# Patient Record
Sex: Male | Born: 1945 | Race: Black or African American | Hispanic: No | Marital: Single | Smoking: Current every day smoker
Health system: Southern US, Community
[De-identification: ages and names within clinical notes are randomized; demographics above are authoritative.]

## PROBLEM LIST (undated history)

## (undated) ENCOUNTER — Emergency Department: Admission: EM | Payer: Medicare HMO | Source: Home / Self Care

## (undated) ENCOUNTER — Emergency Department: Admission: EM | Discharge status: Absent without leave | Payer: Medicare PPO

## (undated) DIAGNOSIS — I739 Peripheral vascular disease, unspecified: Secondary | ICD-10-CM

## (undated) DIAGNOSIS — H409 Unspecified glaucoma: Secondary | ICD-10-CM

## (undated) DIAGNOSIS — I639 Cerebral infarction, unspecified: Secondary | ICD-10-CM

## (undated) DIAGNOSIS — E785 Hyperlipidemia, unspecified: Secondary | ICD-10-CM

## (undated) DIAGNOSIS — E119 Type 2 diabetes mellitus without complications: Secondary | ICD-10-CM

## (undated) DIAGNOSIS — N400 Enlarged prostate without lower urinary tract symptoms: Secondary | ICD-10-CM

## (undated) DIAGNOSIS — D126 Benign neoplasm of colon, unspecified: Secondary | ICD-10-CM

## (undated) DIAGNOSIS — I10 Essential (primary) hypertension: Secondary | ICD-10-CM

## (undated) DIAGNOSIS — G459 Transient cerebral ischemic attack, unspecified: Secondary | ICD-10-CM

## (undated) DIAGNOSIS — R569 Unspecified convulsions: Secondary | ICD-10-CM

## (undated) HISTORY — PX: EYE SURGERY: SHX253

## (undated) HISTORY — PX: COLONOSCOPY: SHX174

## (undated) HISTORY — PX: HERNIA REPAIR: SHX51

## (undated) HISTORY — PX: FOOT SURGERY: SHX648

## (undated) HISTORY — PX: ROTATOR CUFF REPAIR: SHX139

---

## 2004-04-22 ENCOUNTER — Emergency Department: Payer: Self-pay | Admitting: Emergency Medicine

## 2004-05-15 ENCOUNTER — Ambulatory Visit: Payer: Self-pay | Admitting: Podiatry

## 2004-11-13 ENCOUNTER — Ambulatory Visit: Payer: Self-pay | Admitting: Endocrinology

## 2004-11-19 ENCOUNTER — Ambulatory Visit: Payer: Self-pay | Admitting: Endocrinology

## 2005-01-18 ENCOUNTER — Other Ambulatory Visit: Payer: Self-pay

## 2005-01-18 ENCOUNTER — Emergency Department: Payer: Self-pay | Admitting: Emergency Medicine

## 2005-01-28 ENCOUNTER — Ambulatory Visit: Payer: Self-pay

## 2005-04-16 ENCOUNTER — Ambulatory Visit (HOSPITAL_COMMUNITY): Admission: RE | Admit: 2005-04-16 | Discharge: 2005-04-16 | Payer: Self-pay | Admitting: *Deleted

## 2005-06-15 ENCOUNTER — Ambulatory Visit: Payer: Self-pay | Admitting: Internal Medicine

## 2005-06-21 ENCOUNTER — Ambulatory Visit: Payer: Self-pay | Admitting: Psychiatry

## 2005-06-29 ENCOUNTER — Ambulatory Visit: Payer: Self-pay | Admitting: Psychiatry

## 2006-05-23 ENCOUNTER — Ambulatory Visit: Payer: Self-pay | Admitting: Internal Medicine

## 2006-07-03 ENCOUNTER — Other Ambulatory Visit: Payer: Self-pay

## 2006-07-04 ENCOUNTER — Inpatient Hospital Stay: Payer: Self-pay | Admitting: Internal Medicine

## 2007-04-04 ENCOUNTER — Ambulatory Visit: Payer: Self-pay

## 2007-08-18 ENCOUNTER — Ambulatory Visit: Payer: Self-pay | Admitting: Unknown Physician Specialty

## 2007-09-15 ENCOUNTER — Ambulatory Visit: Payer: Self-pay | Admitting: Unknown Physician Specialty

## 2007-09-15 ENCOUNTER — Other Ambulatory Visit: Payer: Self-pay

## 2007-10-04 ENCOUNTER — Ambulatory Visit: Payer: Self-pay | Admitting: Unknown Physician Specialty

## 2007-12-01 ENCOUNTER — Emergency Department: Payer: Self-pay | Admitting: Internal Medicine

## 2007-12-01 ENCOUNTER — Other Ambulatory Visit: Payer: Self-pay

## 2009-01-01 ENCOUNTER — Ambulatory Visit: Payer: Self-pay | Admitting: Internal Medicine

## 2009-06-28 ENCOUNTER — Emergency Department: Payer: Self-pay | Admitting: Internal Medicine

## 2009-07-04 ENCOUNTER — Encounter: Payer: Self-pay | Admitting: Internal Medicine

## 2009-07-30 ENCOUNTER — Encounter: Payer: Self-pay | Admitting: Internal Medicine

## 2009-08-29 ENCOUNTER — Encounter: Payer: Self-pay | Admitting: Internal Medicine

## 2009-11-04 ENCOUNTER — Ambulatory Visit: Payer: Self-pay | Admitting: Gastroenterology

## 2010-01-19 ENCOUNTER — Ambulatory Visit: Payer: Self-pay | Admitting: Internal Medicine

## 2010-02-05 ENCOUNTER — Ambulatory Visit: Payer: Self-pay | Admitting: Neurology

## 2010-09-08 ENCOUNTER — Observation Stay: Payer: Self-pay | Admitting: Internal Medicine

## 2010-09-20 ENCOUNTER — Emergency Department: Payer: Self-pay | Admitting: Emergency Medicine

## 2011-01-08 ENCOUNTER — Emergency Department: Payer: Self-pay | Admitting: *Deleted

## 2011-07-28 ENCOUNTER — Ambulatory Visit: Payer: Self-pay | Admitting: Internal Medicine

## 2011-11-03 ENCOUNTER — Emergency Department: Payer: Self-pay | Admitting: Emergency Medicine

## 2012-10-20 ENCOUNTER — Inpatient Hospital Stay: Payer: Self-pay | Admitting: Internal Medicine

## 2012-10-20 LAB — COMPREHENSIVE METABOLIC PANEL
BUN: 13 mg/dL (ref 7–18)
Bilirubin,Total: 0.3 mg/dL (ref 0.2–1.0)
Calcium, Total: 8.7 mg/dL (ref 8.5–10.1)
Chloride: 105 mmol/L (ref 98–107)
Co2: 27 mmol/L (ref 21–32)
Creatinine: 1.03 mg/dL (ref 0.60–1.30)
EGFR (African American): >60
Osmolality: 277 (ref 275–301)
Potassium: 3.6 mmol/L (ref 3.5–5.1)
SGPT (ALT): 17 U/L (ref 12–78)
Sodium: 137 mmol/L (ref 136–145)
Total Protein: 7.2 g/dL (ref 6.4–8.2)

## 2012-10-20 LAB — URINALYSIS, COMPLETE
Bacteria: NONE SEEN
Bilirubin,UR: NEGATIVE
Blood: NEGATIVE
Leukocyte Esterase: NEGATIVE
Nitrite: NEGATIVE
Ph: 5 (ref 4.5–8.0)
RBC,UR: <1 /HPF (ref 0–5)
WBC UR: 1 /HPF (ref 0–5)

## 2012-10-20 LAB — CBC
MCH: 31.5 pg (ref 26.0–34.0)
RBC: 4.22 10*6/uL — ABNORMAL LOW (ref 4.40–5.90)

## 2012-10-21 LAB — LIPID PANEL
Cholesterol: 173 mg/dL (ref 0–200)
Ldl Cholesterol, Calc: 97 mg/dL (ref 0–100)
Triglycerides: 145 mg/dL (ref 0–200)
VLDL Cholesterol, Calc: 29 mg/dL (ref 5–40)

## 2012-10-21 LAB — HEMOGLOBIN A1C: Hemoglobin A1C: 7 % — ABNORMAL HIGH (ref 4.2–6.3)

## 2014-06-21 NOTE — Discharge Summary (Signed)
PATIENT NAME:  Peter Howard, Peter Howard MR#:  [id number] DATE OF BIRTH:  [birth year]  DATE OF ADMISSION:  10/20/2012 DATE OF DISCHARGE:  10/22/2012  DISCHARGE DIAGNOSES: 1.  Transient ischemic attack.  2.  Atherosclerotic cardiovascular disease.  3.  Seizure disorder.  4.  Diabetes mellitus, noninsulin-requiring.  5.  Benign prostatic hypertrophy.  6.  Depression.  7.  Osteoarthritis.   DISCHARGE MEDICATIONS: Aspirin 81 mg daily, Flomax 0.4 mg daily, oxybutynin 5 mg daily, bisoprolol 5 mg daily, citalopram 20 mg daily, glipizide 5 mg daily, Keppra 750 mg b.i.d., meclizine 25 mg t.i.d. p.r.n. itching, Percocet 5/325, 1 t.i.d. p.r.n. pain, simvastatin 20 mg at bedtime.   REASON FOR ADMISSION: This 69 year old male presented with dysarthria and dysphagia. Please see H and P for history of present illness, past medical history, and physical exam.   HOSPITAL COURSE: The patient was admitted. Enzymes were negative. Brain CT unremarkable. Brain MRI showed old disease, but nothing acute. Carotid Doppler and echocardiogram were unremarkable. The patient was placed on aspirin and Lovenox. His symptoms improved back to baseline. In light of his symptoms, the presumptive diagnosis is a transient ischemic attack. He had not been on any anticoagulation, and aspirin was added at  81 mg daily. He is already on lipid therapy. He will be getting physical therapy for his chronic leg pain and ataxia.   Overall prognosis is guarded.    ____________________________ Rusty Aus, MD mfm:dm D: 10/22/2012 08:10:15 ET T: 10/22/2012 14:47:22 ET JOB#: [id number]  cc: Rusty Aus, MD, <Dictator> Hiliary Osorto Roselee Culver MD ELECTRONICALLY SIGNED 10/24/2012 7:55

## 2014-06-21 NOTE — H&P (Signed)
PATIENT NAME:  Peter Howard, Peter Howard MR#:  [id number] DATE OF BIRTH:  09-Jun-1945  PRIMARY CARE PHYSICIAN: Emily Filbert, MD, of [facility].   CHIEF COMPLAINT: Dysarthria, drooling from the mouth.   HISTORY OF PRESENT ILLNESS: A 69 year old African American male patient with a history of hypertension, diabetes, a prior CVA with residual left-sided weakness, who presents to the Emergency Room complaining of 3 days of dysarthria and drooling from the mouth. This was noticed by the wife. The patient mentioned that he feels like he has some new-onset dysphagia where he has to clear his throat multiple times just with saliva. He has trouble with food. He does have residual right-sided weakness, which has not changed. Decreased sensations on the right have not changed.   No recent change in medications. No fall, syncope, chest pain, shortness of breath.   PAST MEDICAL HISTORY:  1.  CVA x 3.  2.  Seizures.  3.  Diabetes mellitus, type 2.  4.  BPH.  5.  Depression.  6.  Glaucoma.  7.  Hypertension.  8.  CAD, status post stent.  9.  Hernia repair.  10.  Left foot surgery.   ALLERGIES: PENICILLIN.   SOCIAL HISTORY: The patient smokes on and off, is presently trying some nicotine patches. No alcohol. No illicit drugs. He is married, lives with his wife. Ambulates with a cane.   FAMILY HISTORY: The patient is unaware.  REVIEW OF SYSTEMS:  CONSTITUTIONAL: No fever, fatigue, weakness, weight loss, weight gain.  EYES: No blurry vision, pain, redness. Has glaucoma.  ENT: No tinnitus, ear pain, hearing loss.  RESPIRATORY: No cough, wheezing, hemoptysis.  CARDIOVASCULAR: No chest pain, orthopnea, edema.  GASTROINTESTINAL: No nausea, vomiting, diarrhea, abdominal pain.  GENITOURINARY: No dysuria, hematuria or frequency. Has BPH.  ENDOCRINE: No polyuria, nocturia or thyroid problems.  HEMATOLOGIC, LYMPHATIC: No anemia, easy bruising, bleeding.  INTEGUMENTARY: No acne, rash, lesions.  MUSCULOSKELETAL: Has some  arthritis. No back pain.  NEUROLOGIC: Has residual right-sided weakness and numbness from prior stroke. Has new dysarthria and dysphagia.  PSYCHIATRIC: No anxiety or depression.    HOME MEDICATIONS:  Include:  1.  Flomax 0.4 mg oral daily.  2.  Oxybutynin 5 mg daily.  3.  Bystolic 5 mg daily.  4.  Citalopram 20 mg oral daily.   6.  Glipizide 5 mg oral daily.  7.  Hydroxyzine 25 mg every 6 hours as needed for itching.  8.  Keppra 750 mg oral twice a day.   10.  Meclizine 25 mg oral 3 times a day.  11.  Nystatin 100,000 units topically to affected area 2 times a day as needed.  12.  Percocet 5/325 at 1 tablet 3 times a day as needed.  13.  Simvastatin 20 mg oral once a day.   PHYSICAL EXAMINATION:  VITAL SIGNS: Temperature 98.2, pulse of 81, blood pressure 118/68, saturating 97% on room air.  GENERAL: Moderately built [place] American male patient lying in bed, comfortable, conversational, cooperative with exam.  PSYCHIATRIC: Alert and oriented x 3. Has a flat affect.  HEENT: Atraumatic, normocephalic. Oral mucosa moist and pink. External ears and nose normal. No pallor. No icterus. Pupils bilaterally equal and reactive to light. No facial droop.  NECK: Supple. No thyromegaly. No palpable lymph nodes. Trachea midline. No carotid bruit, JVD.  CARDIOVASCULAR: S1, S2, without any murmurs. Peripheral pulses 2+. No edema.  RESPIRATORY: Normal work of breathing. Clear to auscultation, both sides.  GASTROINTESTINAL: Soft abdomen, nontender. Bowel sounds present. No organomegaly  palpable.  GENITOURINARY: No CVA tenderness or bladder distention.  SKIN: Warm and dry. No petechiae, rash, ulcers.  MUSCULOSKELETAL: No joint swelling, redness, effusion of the large joints. Normal muscle tone.  NEUROLOGICAL: Motor 5-/5 on the left side and 4/5 on the right side. Decreased sensation on the right. Hyperreflexia all over. Cranial nerves II through XII intact. Does have dysarthria.   LYMPHATIC: No  cervical, supraclavicular lymphadenopathy.   LABORATORY STUDIES: Glucose of 163, BUN 13, creatinine 1.03, sodium 137, potassium  3.6. AST, ALT, alkaline phosphatase, bilirubin normal. WBC 7, hemoglobin 13.3, platelets of 176.   EKG shows normal sinus rhythm. Has T-wave inversions in the inferior leads with no change compared to prior EKG.   CT scan of the head without contrast shows basal ganglia calcification and some atrophy. No new or old strokes.   Chest x-ray, portable, shows no acute cardiopulmonary disease.   ASSESSMENT AND PLAN:  1.  Acute onset of dysarthria and dysphagia with persistent symptoms. We will admit the patient to rule out cerebrovascular accident. The patient is beyond the window for t-PA. His symptoms have been there for 3 days. We will get an MRI, carotid Dopplers and echocardiogram with neuro checks every 4 hours. We will consult neurology for further input with the case. The patient does have residual right-sided weakness, which is unchanged. He does have hyperreflexia, which has been worked up in the past with no clear etiology. He does have some calcification in the basal ganglia, which could explain this. The patient will be on aspirin, statin and Lovenox for DVT prophylaxis.  2.  Hypertension. We will continue the Bystolic. Hold the felodipine. Will let the blood pressure run high normal.  3.  Diabetes mellitus. Hold the glipizide, as the patient will be n.p.o. until he gets a swallow evaluation for his dysphagia. We will put him on sliding scale insulin.  4.  Seizures. Continue Keppra. The patient had an episode of a seizure in 2012. Has not had  any further episodes. Will await neurology input to see if the [place] can be stopped.  5.  Deep vein thrombosis prophylaxis; Lovenox.   CODE STATUS: FULL CODE.    TIME SPENT TODAY ON THIS CASE: 60 minutes.    ____________________________ Leia Alf Britley Gashi, MD srs:np D: 10/20/2012 16:53:00 ET T: 10/20/2012 17:24:16  ET JOB#: [id number]  cc: Rusty Aus, MD Lianette Broussard R. Laine Giovanetti, MD, <Dictator>   Neita Carp MD ELECTRONICALLY SIGNED 10/20/2012 22:05

## 2014-08-04 ENCOUNTER — Emergency Department: Payer: Medicare PPO

## 2014-08-04 DIAGNOSIS — S79912A Unspecified injury of left hip, initial encounter: Secondary | ICD-10-CM | POA: Diagnosis present

## 2014-08-04 DIAGNOSIS — S3991XA Unspecified injury of abdomen, initial encounter: Secondary | ICD-10-CM | POA: Insufficient documentation

## 2014-08-04 DIAGNOSIS — Z880 Allergy status to penicillin: Secondary | ICD-10-CM | POA: Diagnosis not present

## 2014-08-04 DIAGNOSIS — S50312A Abrasion of left elbow, initial encounter: Secondary | ICD-10-CM | POA: Diagnosis not present

## 2014-08-04 DIAGNOSIS — Z8673 Personal history of transient ischemic attack (TIA), and cerebral infarction without residual deficits: Secondary | ICD-10-CM | POA: Insufficient documentation

## 2014-08-04 DIAGNOSIS — Y998 Other external cause status: Secondary | ICD-10-CM | POA: Diagnosis not present

## 2014-08-04 DIAGNOSIS — Y9289 Other specified places as the place of occurrence of the external cause: Secondary | ICD-10-CM | POA: Insufficient documentation

## 2014-08-04 DIAGNOSIS — M6281 Muscle weakness (generalized): Secondary | ICD-10-CM | POA: Insufficient documentation

## 2014-08-04 DIAGNOSIS — S7002XA Contusion of left hip, initial encounter: Secondary | ICD-10-CM | POA: Diagnosis not present

## 2014-08-04 DIAGNOSIS — S299XXA Unspecified injury of thorax, initial encounter: Secondary | ICD-10-CM | POA: Diagnosis not present

## 2014-08-04 DIAGNOSIS — Y9389 Activity, other specified: Secondary | ICD-10-CM | POA: Insufficient documentation

## 2014-08-04 DIAGNOSIS — W010XXA Fall on same level from slipping, tripping and stumbling without subsequent striking against object, initial encounter: Secondary | ICD-10-CM | POA: Diagnosis not present

## 2014-08-04 NOTE — ED Notes (Signed)
Patient reports having history of falls since stroke.  Reports he was walking this evening and fell.  Pt c/o pain to left side.

## 2014-08-05 ENCOUNTER — Emergency Department: Payer: Medicare PPO

## 2014-08-05 ENCOUNTER — Emergency Department
Admission: EM | Admit: 2014-08-05 | Discharge: 2014-08-05 | Disposition: A | Discharge status: Home / Self Care | Payer: Medicare PPO | Attending: Emergency Medicine | Admitting: Emergency Medicine

## 2014-08-05 DIAGNOSIS — M25552 Pain in left hip: Secondary | ICD-10-CM

## 2014-08-05 NOTE — ED Provider Notes (Signed)
[facility] Emergency Department Provider Note  ____________________________________________  Time seen: 12:40 AM  I have reviewed the triage vital signs and the nursing notes.   HISTORY  Chief Complaint Fall    HPI Peter Howard. is a 69 y.o. male who reports a history of frequent falls due to a prior stroke. He walks with a cane but still falls often after losing his balance. Tonight he was walking out of the store and when he got to the sidewalk he lost his balance and fell onto his left side. He reports pain in the left hip and left chest wall and left abdomen. He also has a small abrasion of the left elbow. Denies any head injury or loss of consciousness no chest pain shortness of breath abdominal pain back pain or headache or vision change before or after the fall.     No past medical history on file. Stroke There are no active problems to display for this patient.   No past surgical history on file.  No current outpatient prescriptions on file.  Allergies Penicillins  No family history on file.  Social History History  Substance Use Topics  . Smoking status: Not on file  . Smokeless tobacco: Not on file  . Alcohol Use: Not on file    Review of Systems  Constitutional: No fever or chills. No weight changes Eyes:No blurry vision or double vision.  ENT: No sore throat. Cardiovascular: No chest pain. Respiratory: No dyspnea or cough. Gastrointestinal: Negative for abdominal pain, vomiting and diarrhea.  No BRBPR or melena. Genitourinary: Negative for dysuria, urinary retention, bloody urine, or difficulty urinating. Musculoskeletal: Negative for back pain. No joint swelling or pain. Skin: Negative for rash. Neurological: Chronic left leg weakness Psychiatric:No anxiety or depression.   Endocrine:No hot/cold intolerance, changes in energy, or sleep difficulty.  10-point ROS otherwise  negative.  ____________________________________________   PHYSICAL EXAM:  VITAL SIGNS: ED Triage Vitals  Enc Vitals Group     BP 08/04/14 2145 128/78 mmHg     Pulse Rate 08/04/14 2145 66     Resp 08/04/14 2145 20     Temp 08/04/14 2145 98.6 F (37 C)     Temp Source 08/04/14 2145 Oral     SpO2 08/04/14 2145 99 %     Weight 08/04/14 2145 145 lb (65.772 kg)     Height 08/04/14 2145 5\' 8"  (1.727 m)     Head Cir --      Peak Flow --      Pain Score 08/04/14 2146 9     Pain Loc --      Pain Edu? --      Excl. in [place]? --      Constitutional: Alert and oriented. Well appearing and in no distress. Eyes: No scleral icterus. No conjunctival pallor. PERRL. EOMI ENT   Head: Normocephalic and atraumatic.   Nose: No congestion/rhinnorhea. No septal hematoma   Mouth/Throat: MMM, no pharyngeal erythema. No peritonsillar mass. No uvula shift.   Neck: No stridor. No SubQ emphysema. No meningismus. Hematological/Lymphatic/Immunilogical: No cervical lymphadenopathy. Cardiovascular: RRR. Normal and symmetric distal pulses are present in all extremities. No murmurs, rubs, or gallops. Respiratory: Normal respiratory effort without tachypnea nor retractions. Breath sounds are clear and equal bilaterally. No wheezes/rales/rhonchi. Gastrointestinal: Soft and nontender. No distention. There is no CVA tenderness.  No rebound, rigidity, or guarding. No abdominal ecchymosis Genitourinary: deferred Musculoskeletal: Left hip tenderness to palpation over the greater trochanter. The left elbow shows a  1-2 cm superficial abrasion over the medial aspect of the elbow. The elbow has full range of motion and has no bony tenderness.  Neurologic:   Normal speech and language.  CN 2-10 normal. Motor grossly intact. No pronator drift.  Normal gait. No gross focal neurologic deficits are appreciated.  Skin:  Skin is warm, dry and intact. No rash noted.  No petechiae, purpura, or bullae. Psychiatric:  Mood and affect are normal. Speech and behavior are normal. Patient exhibits appropriate insight and judgment.  ____________________________________________    LABS (pertinent positives/negatives) (all labs ordered are listed, but only abnormal results are displayed) Labs Reviewed - No data to display ____________________________________________   EKG    ____________________________________________    RADIOLOGY  X-ray ribs unremarkable  ____________________________________________   PROCEDURES  ____________________________________________   INITIAL IMPRESSION / ASSESSMENT AND PLAN / ED COURSE  Pertinent labs & imaging results that were available during my care of the patient were reviewed by me and considered in my medical decision making (see chart for details).  Patient presents with a fall and contusion. X-rays do not reveal reveal any acute bony injuries, and there is low suspicion for acute intracranial injury or spinal injury. Very low suspicion of any preceding medical event such as stroke ACS PE TAD triple a or sepsis. ----------------------------------------- 1:36 AM on 08/05/2014 -----------------------------------------  Patient refuses x-ray of left hip. He insists that he does not have any left hip pain ____________________________________________   FINAL CLINICAL IMPRESSION(S) / ED DIAGNOSES  Final diagnoses:  Hip pain, acute, left   left hip contusion    Carrie Mew, MD 08/05/14 [phone]

## 2014-08-05 NOTE — Discharge Instructions (Signed)

## 2014-08-09 MED ORDER — ONDANSETRON HCL 4 MG/2ML IJ SOLN
INTRAMUSCULAR | Status: AC
  Filled 2014-08-09: qty 2

## 2015-01-11 ENCOUNTER — Emergency Department: Payer: Medicare PPO

## 2015-01-11 ENCOUNTER — Emergency Department
Admission: EM | Admit: 2015-01-11 | Discharge: 2015-01-11 | Disposition: A | Discharge status: Home / Self Care | Payer: Medicare PPO | Attending: Emergency Medicine | Admitting: Emergency Medicine

## 2015-01-11 ENCOUNTER — Encounter: Payer: Self-pay | Admitting: Emergency Medicine

## 2015-01-11 DIAGNOSIS — R1032 Left lower quadrant pain: Secondary | ICD-10-CM | POA: Diagnosis present

## 2015-01-11 DIAGNOSIS — Z880 Allergy status to penicillin: Secondary | ICD-10-CM | POA: Diagnosis not present

## 2015-01-11 DIAGNOSIS — N50812 Left testicular pain: Secondary | ICD-10-CM | POA: Diagnosis not present

## 2015-01-11 DIAGNOSIS — E119 Type 2 diabetes mellitus without complications: Secondary | ICD-10-CM | POA: Insufficient documentation

## 2015-01-11 DIAGNOSIS — I10 Essential (primary) hypertension: Secondary | ICD-10-CM | POA: Diagnosis not present

## 2015-01-11 DIAGNOSIS — Z720 Tobacco use: Secondary | ICD-10-CM | POA: Insufficient documentation

## 2015-01-11 DIAGNOSIS — N50819 Testicular pain, unspecified: Secondary | ICD-10-CM

## 2015-01-11 HISTORY — DX: Essential (primary) hypertension: I10

## 2015-01-11 HISTORY — DX: Unspecified glaucoma: H40.9

## 2015-01-11 HISTORY — DX: Cerebral infarction, unspecified: I63.9

## 2015-01-11 HISTORY — DX: Type 2 diabetes mellitus without complications: E11.9

## 2015-01-11 LAB — COMPREHENSIVE METABOLIC PANEL
ALBUMIN: 4.1 g/dL (ref 3.5–5.0)
ALT: 13 U/L — ABNORMAL LOW (ref 17–63)
ANION GAP: 7 (ref 5–15)
AST: 20 U/L (ref 15–41)
Alkaline Phosphatase: 51 U/L (ref 38–126)
BUN: 9 mg/dL (ref 6–20)
CO2: 30 mmol/L (ref 22–32)
Calcium: 9.1 mg/dL (ref 8.9–10.3)
Chloride: 102 mmol/L (ref 101–111)
Creatinine, Ser: 0.99 mg/dL (ref 0.61–1.24)
GFR calc non Af Amer: >60 mL/min (ref >60)
GLUCOSE: 120 mg/dL — AB (ref 65–99)
POTASSIUM: 3.5 mmol/L (ref 3.5–5.1)
SODIUM: 139 mmol/L (ref 135–145)
Total Bilirubin: 0.5 mg/dL (ref 0.3–1.2)
Total Protein: 7.7 g/dL (ref 6.5–8.1)

## 2015-01-11 LAB — CBC
HEMATOCRIT: 43.2 % (ref 40.0–52.0)
HEMOGLOBIN: 14.2 g/dL (ref 13.0–18.0)
MCH: 30.8 pg (ref 26.0–34.0)
MCHC: 32.8 g/dL (ref 32.0–36.0)
MCV: 94 fL (ref 80.0–100.0)
Platelets: 236 10*3/uL (ref 150–440)
RBC: 4.59 MIL/uL (ref 4.40–5.90)
RDW: 14.2 % (ref 11.5–14.5)
WBC: 6.6 10*3/uL (ref 3.8–10.6)

## 2015-01-11 LAB — URINALYSIS COMPLETE WITH MICROSCOPIC (ARMC ONLY)
BACTERIA UA: NONE SEEN
Bilirubin Urine: NEGATIVE
Glucose, UA: NEGATIVE mg/dL
Hgb urine dipstick: NEGATIVE
KETONES UR: NEGATIVE mg/dL
NITRITE: NEGATIVE
PH: 5 (ref 5.0–8.0)
PROTEIN: NEGATIVE mg/dL
SPECIFIC GRAVITY, URINE: 1.012 (ref 1.005–1.030)

## 2015-01-11 MED ORDER — HYDROMORPHONE HCL 1 MG/ML IJ SOLN
0.5000 mg | Freq: Once | INTRAMUSCULAR | Status: AC
  Administered 2015-01-11: 0.5 mg via INTRAVENOUS
  Filled 2015-01-11: qty 1

## 2015-01-11 MED ORDER — OXYCODONE-ACETAMINOPHEN 5-325 MG PO TABS: 2.0000 | ORAL_TABLET | Freq: Four times a day (QID) | ORAL | Status: DC | PRN

## 2015-01-11 MED ORDER — CIPROFLOXACIN HCL 500 MG PO TABS: 500.0000 mg | ORAL_TABLET | Freq: Two times a day (BID) | ORAL | Status: AC

## 2015-01-11 MED ORDER — OXYCODONE-ACETAMINOPHEN 5-325 MG PO TABS
2.0000 | ORAL_TABLET | Freq: Once | ORAL | Status: AC
  Administered 2015-01-11: 2 via ORAL
  Filled 2015-01-11: qty 2

## 2015-01-11 MED ORDER — CIPROFLOXACIN HCL 500 MG PO TABS
500.0000 mg | ORAL_TABLET | Freq: Once | ORAL | Status: AC
  Administered 2015-01-11: 500 mg via ORAL
  Filled 2015-01-11: qty 1

## 2015-01-11 NOTE — ED Notes (Signed)
States pain steadily increasing.

## 2015-01-11 NOTE — Discharge Instructions (Signed)
Flank Pain Flank pain refers to pain that is located on the side of the body between the upper abdomen and the back. The pain may occur over a short period of time (acute) or may be long-term or reoccurring (chronic). It may be mild or severe. Flank pain can be caused by many things. CAUSES  Some of the more common causes of flank pain include:  Muscle strains.   Muscle spasms.   A disease of your spine (vertebral disk disease).   A lung infection (pneumonia).   Fluid around your lungs (pulmonary edema).   A kidney infection.   Kidney stones.   A very painful skin rash caused by the chickenpox virus (shingles).   Gallbladder disease.  [place] care will depend on the cause of your pain. In general,  Rest as directed by your caregiver.  Drink enough fluids to keep your urine clear or pale yellow.  Only take over-the-counter or prescription medicines as directed by your caregiver. Some medicines may help relieve the pain.  Tell your caregiver about any changes in your pain.  Follow up with your caregiver as directed. SEEK IMMEDIATE MEDICAL CARE IF:   Your pain is not controlled with medicine.   You have new or worsening symptoms.  Your pain increases.   You have abdominal pain.   You have shortness of breath.   You have persistent nausea or vomiting.   You have swelling in your abdomen.   You feel faint or pass out.   You have blood in your urine.  You have a fever or persistent symptoms for more than 2-3 days.  You have a fever and your symptoms suddenly get worse. MAKE SURE YOU:   Understand these instructions.  Will watch your condition.  Will get help right away if you are not doing well or get worse.   This information is not intended to replace advice given to you by your health care provider. Make sure you discuss any questions you have with your health care provider.   Document Released: 04/08/2005 Document  Revised: 11/10/2011 Document Reviewed: 09/30/2011 Elsevier Interactive Patient Education 2016 [employer] Orchitis Orchitis is swelling (inflammation) of a testicle caused by infection. Testicles are the male organs that produce sperm. The testicles are held in a fleshy sac (scrotum) located behind the penis. Orchitis usually affects only one testicle, but it can occur in both. The condition can develop suddenly. Orchitis can be caused by many different kinds of bacteria and viruses. CAUSES Orchitis can be caused by either a bacterial or viral infection. Bacterial Infections  These often occur along with an infection of the coiled tube that collects sperm and sits on top of the testicle (epididymis).  In men who are not sexually active, bacterial orchitis usually starts as a urinary tract infection and spreads to the testicle.  In sexually active men, sexually transmitted infections are the most common cause of bacterial orchitis. These can include:  Gonorrhea.  Chlamydia. Viral Infections  Mumps is still the most common cause of viral orchitis, though mumps is now rare in many areas because of vaccination.  Other viruses that can cause orchitis include:  The chickenpox virus (varicella-zoster virus).  The virus that causes mononucleosis (Epstein-Barr virus). RISK FACTORS Boys and men who have not been vaccinated against mumps are at risk for mumps orchitis. Risk factors for bacterial orchitis include:  Frequent urinary tract infections.  High-risk sexual behaviors.  Having a sexual partner with a sexually  transmitted infection.  Having had urinary tract surgery.  Using a tube passed through the penis to drain urine (Foley catheter).  An enlarged prostate gland. SIGNS AND SYMPTOMS The most common symptoms of orchitis are swelling and pain in the scrotum. Other signs and symptoms may include:  Feeling generally sick (malaise).  Fever and chills.  Painful  urination.  Painful ejaculation.  Blood or discharge from the penis.  Nausea.  Headache.  Fatigue. DIAGNOSIS Your health care provider may suspect orchitis if you have a painful, swollen testicle along with other signs and symptoms of the condition. A physical exam will be done. Tests may also be done to help your health care provider make a diagnosis. These may include:  A blood test to check for signs of infection.  A urine test to check for a urinary tract infection.  Using a swab to collect a fluid sample from the tip of the penis to test for sexually transmitted infections.  Taking an image of the testicle using sound waves and a computer (testicular ultrasound). TREATMENT Treatment of orchitis depends on the cause. For orchitis caused by a bacterial infection, your health care provider will most likely prescribe antibiotic medicines. Bacterial infections usually clear up within a few days. Both viral infections and bacterial infections may be treated with:  Bed rest.  Anti-inflammatory medicines.  Pain medicines.  Elevating the scrotum and applying ice. HOME CARE INSTRUCTIONS  Rest as directed by your health care provider.  Take medicines only as directed by your health care provider.  If you were prescribed an antibiotic medicine, finish it all even if you start to feel better.  Elevate your scrotum and apply ice as directed:  Put ice in a plastic bag.  Place a small towel or pillow between your legs.  Rest your scrotum on the pillow or towel.  Place another towel between your skin and the plastic bag.  Leave the ice on for 20 minutes, 2-3 times a day. SEEK MEDICAL CARE IF:  You have a fever.  Pain and swelling have not gotten better after 3 days. SEEK IMMEDIATE MEDICAL CARE IF:  Your pain is getting worse.  The swelling in your testicle gets worse.   This information is not intended to replace advice given to you by your health care provider. Make  sure you discuss any questions you have with your health care provider.   Document Released: 02/13/2000 Document Revised: 03/08/2014 Document Reviewed: 07/05/2013 Elsevier Interactive Patient Education [employer].

## 2015-01-11 NOTE — ED Provider Notes (Signed)
[facility] Emergency Department Provider Note     Time seen: ----------------------------------------- 5:33 PM on 01/11/2015 -----------------------------------------    I have reviewed the triage vital signs and the nursing notes.   HISTORY  Chief Complaint Groin Pain    HPI Peter Guba. is a 69 y.o. male who presents ER for left groin pain. Patient complains of pain for several days, states the pain is steadily increasing and radiates into his left testicle, nothing makes his pain better or worse. Patient states doesn't history of hernia repair in the past.   Past Medical History  Diagnosis Date  . Diabetes mellitus without complication ([place])   . Hypertension   . Glaucoma   . Stroke [facility])     There are no active problems to display for this patient.   Past Surgical History  Procedure Laterality Date  . Hernia repair      Allergies Penicillins  Social History Social History  Substance Use Topics  . Smoking status: Current Some Day Smoker  . Smokeless tobacco: None  . Alcohol Use: Yes    Review of Systems Constitutional: Negative for fever. Eyes: Negative for visual changes. ENT: Negative for sore throat. Cardiovascular: Negative for chest pain. Respiratory: Negative for shortness of breath. Gastrointestinal: Positive for abdominal pain Genitourinary: Negative for dysuria. Musculoskeletal: Negative for back pain. Skin: Negative for rash. Neurological: Negative for headaches, focal weakness or numbness.  10-point ROS otherwise negative.  ____________________________________________   PHYSICAL EXAM:  VITAL SIGNS: ED Triage Vitals  Enc Vitals Group     BP 01/11/15 1602 143/85 mmHg     Pulse Rate 01/11/15 1602 80     Resp 01/11/15 1602 18     Temp 01/11/15 1602 97.9 F (36.6 C)     Temp Source 01/11/15 1602 Oral     SpO2 01/11/15 1602 100 %     Weight 01/11/15 1602 150 lb (68.04 kg)     Height 01/11/15 1602 5'  7" (1.702 m)     Head Cir --      Peak Flow --      Pain Score 01/11/15 1602 10     Pain Loc --      Pain Edu? --      Excl. in [place]? --     Constitutional: Alert and oriented. Well appearing and in no distress. Eyes: Conjunctivae are normal. PERRL. Normal extraocular movements. ENT   Head: Normocephalic and atraumatic.   Nose: No congestion/rhinnorhea.   Mouth/Throat: Mucous membranes are moist.   Neck: No stridor. Cardiovascular: Normal rate, regular rhythm. Normal and symmetric distal pulses are present in all extremities. No murmurs, rubs, or gallops. Respiratory: Normal respiratory effort without tachypnea nor retractions. Breath sounds are clear and equal bilaterally. No wheezes/rales/rhonchi. Gastrointestinal: Left flank tenderness, no rebound or guarding. Normal bowel sounds. Genitourinary: Left testicular tenderness, left groin and inguinal tenderness, no hernia present. Musculoskeletal: Nontender with normal range of motion in all extremities. No joint effusions.  No lower extremity tenderness nor edema. Neurologic:  Normal speech and language. No gross focal neurologic deficits are appreciated. Speech is normal. No gait instability. Skin:  Skin is warm, dry and intact. No rash noted. Psychiatric: Mood and affect are normal. Speech and behavior are normal. Patient exhibits appropriate insight and judgment. ____________________________________________  ED COURSE:  Pertinent labs & imaging results that were available during my care of the patient were reviewed by me and considered in my medical decision making (see chart for details). Patient's no  acute distress, will check basic labs and likely imaging. ____________________________________________    LABS (pertinent positives/negatives)  Labs Reviewed  COMPREHENSIVE METABOLIC PANEL - Abnormal; Notable for the following:    Glucose, Bld 120 (*)    ALT 13 (*)    All other components within normal limits   URINALYSIS COMPLETEWITH MICROSCOPIC ([facility] ONLY) - Abnormal; Notable for the following:    Color, Urine YELLOW (*)    APPearance CLEAR (*)    Leukocytes, UA TRACE (*)    Squamous Epithelial / LPF 0-5 (*)    All other components within normal limits  CBC    RADIOLOGY Images were viewed by me  CT renal protocol IMPRESSION: No acute abnormality abdomen or pelvis. Negative for urinary tract stone.  Mild prostatomegaly. Mild thickening of the urinary bladder walls is likely related to enlargement of the prostate gland rather than cystitis.  Atherosclerosis.  Diverticulosis without diverticulitis.  Small hiatal hernia. IMPRESSION: Testicles normal in size with normal vascularity, although there is slight heterogeneity to the left testicle. Small minimally complicated bilateral hydroceles.  1 cm right epididymal cyst/spermatocele and 1.3 cm minimally complicated left epididymal cyst/spermatocele. ____________________________________________  FINAL ASSESSMENT AND PLAN  Groin pain, testicular pain  Plan: Patient with labs and imaging as dictated above. No clear etiology for his symptoms. He will be given antibiotics to cover for epididymal orchitis. He may be having pain from scar tissue from remote hernia surgery. This point he is stable for discharge with pain medicine and Cipro.   Earleen Newport, MD   Earleen Newport, MD 01/11/15 [phone]

## 2015-03-12 ENCOUNTER — Encounter: Payer: Self-pay | Admitting: *Deleted

## 2015-03-13 ENCOUNTER — Encounter
Admission: RE | Disposition: A | Discharge status: Home / Self Care | Payer: Self-pay | Source: Ambulatory Visit | Attending: Gastroenterology

## 2015-03-13 ENCOUNTER — Ambulatory Visit: Payer: Medicare PPO | Admitting: Anesthesiology

## 2015-03-13 ENCOUNTER — Ambulatory Visit
Admission: RE | Admit: 2015-03-13 | Discharge: 2015-03-13 | Disposition: A | Discharge status: Home / Self Care | Payer: Medicare PPO | Source: Ambulatory Visit | Attending: Gastroenterology | Admitting: Gastroenterology

## 2015-03-13 ENCOUNTER — Encounter: Payer: Self-pay | Admitting: *Deleted

## 2015-03-13 DIAGNOSIS — Z800 Family history of malignant neoplasm of digestive organs: Secondary | ICD-10-CM | POA: Diagnosis not present

## 2015-03-13 DIAGNOSIS — F17210 Nicotine dependence, cigarettes, uncomplicated: Secondary | ICD-10-CM | POA: Diagnosis not present

## 2015-03-13 DIAGNOSIS — I739 Peripheral vascular disease, unspecified: Secondary | ICD-10-CM | POA: Diagnosis not present

## 2015-03-13 DIAGNOSIS — H409 Unspecified glaucoma: Secondary | ICD-10-CM | POA: Insufficient documentation

## 2015-03-13 DIAGNOSIS — N400 Enlarged prostate without lower urinary tract symptoms: Secondary | ICD-10-CM | POA: Diagnosis not present

## 2015-03-13 DIAGNOSIS — I10 Essential (primary) hypertension: Secondary | ICD-10-CM | POA: Diagnosis not present

## 2015-03-13 DIAGNOSIS — E119 Type 2 diabetes mellitus without complications: Secondary | ICD-10-CM | POA: Insufficient documentation

## 2015-03-13 DIAGNOSIS — Z79899 Other long term (current) drug therapy: Secondary | ICD-10-CM | POA: Insufficient documentation

## 2015-03-13 DIAGNOSIS — K5730 Diverticulosis of large intestine without perforation or abscess without bleeding: Secondary | ICD-10-CM | POA: Insufficient documentation

## 2015-03-13 DIAGNOSIS — E785 Hyperlipidemia, unspecified: Secondary | ICD-10-CM | POA: Insufficient documentation

## 2015-03-13 DIAGNOSIS — Z880 Allergy status to penicillin: Secondary | ICD-10-CM | POA: Diagnosis not present

## 2015-03-13 DIAGNOSIS — Z809 Family history of malignant neoplasm, unspecified: Secondary | ICD-10-CM | POA: Insufficient documentation

## 2015-03-13 DIAGNOSIS — Z7982 Long term (current) use of aspirin: Secondary | ICD-10-CM | POA: Diagnosis not present

## 2015-03-13 DIAGNOSIS — I69820 Aphasia following other cerebrovascular disease: Secondary | ICD-10-CM | POA: Diagnosis not present

## 2015-03-13 DIAGNOSIS — Z833 Family history of diabetes mellitus: Secondary | ICD-10-CM | POA: Diagnosis not present

## 2015-03-13 DIAGNOSIS — Z86010 Personal history of colonic polyps: Secondary | ICD-10-CM | POA: Insufficient documentation

## 2015-03-13 HISTORY — PX: COLONOSCOPY: SHX5424

## 2015-03-13 HISTORY — DX: Peripheral vascular disease, unspecified: I73.9

## 2015-03-13 HISTORY — DX: Benign neoplasm of colon, unspecified: D12.6

## 2015-03-13 HISTORY — DX: Transient cerebral ischemic attack, unspecified: G45.9

## 2015-03-13 HISTORY — DX: Hyperlipidemia, unspecified: E78.5

## 2015-03-13 HISTORY — DX: Benign prostatic hyperplasia without lower urinary tract symptoms: N40.0

## 2015-03-13 LAB — GLUCOSE, CAPILLARY: Glucose-Capillary: 73 mg/dL (ref 65–99)

## 2015-03-13 SURGERY — COLONOSCOPY
Anesthesia: General

## 2015-03-13 MED ORDER — LIDOCAINE HCL (CARDIAC) 20 MG/ML IV SOLN
INTRAVENOUS | Status: DC | PRN
  Administered 2015-03-13: 50 mg via INTRAVENOUS
  Administered 2015-03-13: 20 mg via INTRAVENOUS
  Administered 2015-03-13: 60 mg via INTRAVENOUS
  Administered 2015-03-13: 50 mg via INTRAVENOUS
  Administered 2015-03-13: 20 mg via INTRAVENOUS

## 2015-03-13 MED ORDER — MIDAZOLAM HCL 2 MG/2ML IJ SOLN
INTRAMUSCULAR | Status: DC | PRN
  Administered 2015-03-13 (×2): 1 mg via INTRAVENOUS

## 2015-03-13 MED ORDER — SODIUM CHLORIDE 0.9 % IV SOLN
INTRAVENOUS | Status: DC
Start: 2015-03-13 — End: 2015-03-13

## 2015-03-13 MED ORDER — SODIUM CHLORIDE 0.9 % IV SOLN
INTRAVENOUS | Status: DC
  Administered 2015-03-13 (×2): via INTRAVENOUS

## 2015-03-13 MED ORDER — PHENYLEPHRINE HCL 10 MG/ML IJ SOLN
INTRAMUSCULAR | Status: DC | PRN
  Administered 2015-03-13: 100 ug via INTRAVENOUS

## 2015-03-13 NOTE — Anesthesia Preprocedure Evaluation (Addendum)
Anesthesia Evaluation  Patient identified by MRN, date of birth, ID band Patient awake    Reviewed: Allergy & Precautions, H&P , NPO status , Patient's Chart, lab work & pertinent test results, reviewed documented beta blocker date and time   Airway Mallampati: II  TM Distance: >3 FB Neck ROM: full    Dental no notable dental hx.    Pulmonary neg pulmonary ROS, Current Smoker,    Pulmonary exam normal breath sounds clear to auscultation       Cardiovascular Exercise Tolerance: Poor hypertension, + Peripheral Vascular Disease  negative cardio ROS   Rhythm:regular Rate:Normal     Neuro/Psych Mild speech dysfunction following cva TIACVA, Residual Symptoms negative neurological ROS  negative psych ROS   GI/Hepatic negative GI ROS, Neg liver ROS,   Endo/Other  negative endocrine ROSdiabetes, Well Controlled  Renal/GU negative Renal ROS  negative genitourinary   Musculoskeletal   Abdominal   Peds  Hematology negative hematology ROS (+)   Anesthesia Other Findings   Reproductive/Obstetrics negative OB ROS                            Anesthesia Physical Anesthesia Plan  ASA: III  Anesthesia Plan: General   Post-op Pain Management:    Induction:   Airway Management Planned:   Additional Equipment:   Intra-op Plan:   Post-operative Plan:   Informed Consent: I have reviewed the patients History and Physical, chart, labs and discussed the procedure including the risks, benefits and alternatives for the proposed anesthesia with the patient or authorized representative who has indicated his/her understanding and acceptance.   Dental Advisory Given  Plan Discussed with: CRNA  Anesthesia Plan Comments:        Anesthesia Quick Evaluation

## 2015-03-13 NOTE — H&P (Signed)
  Date of Initial H&P: 02/17/2015  History reviewed, patient examined, no change in status, stable for surgery.

## 2015-03-13 NOTE — Op Note (Signed)
[facility] Gastroenterology Patient Name: Timmothy Nonnemacher Procedure Date: 03/13/2015 7:56 AM MRN: [id number] Account #: [id number] Date of Birth: 09/26/1945 Admit Type: Outpatient Age: 70 Room: [facility] ENDO ROOM 4 Gender: Male Note Status: Finalized Procedure:         Colonoscopy Indications:       Family history of colon cancer in a first-degree relative,                     Personal history of colonic polyps Providers:         Lupita Dawn. Candace Cruise, MD Referring MD:      Rusty Aus, MD (Referring MD) Medicines:         Monitored Anesthesia Care Complications:     No immediate complications. Procedure:         Pre-Anesthesia Assessment:                    - Prior to the procedure, a History and Physical was                     performed, and patient medications, allergies and                     sensitivities were reviewed. The patient's tolerance of                     previous anesthesia was reviewed.                    - The risks and benefits of the procedure and the sedation                     options and risks were discussed with the patient. All                     questions were answered and informed consent was obtained.                    - After reviewing the risks and benefits, the patient was                     deemed in satisfactory condition to undergo the procedure.                    After obtaining informed consent, the colonoscope was                     passed under direct vision. Throughout the procedure, the                     patient's blood pressure, pulse, and oxygen saturations                     were monitored continuously. The Colonoscope was                     introduced through the anus and advanced to the the cecum,                     identified by appendiceal orifice and ileocecal valve. The                     colonoscopy was performed without difficulty. The patient  tolerated the procedure well. The quality of the  bowel                     preparation was good. Findings:      Multiple small-mouthed diverticula were found in the sigmoid colon.      The exam was otherwise without abnormality. Impression:        - Diverticulosis in the sigmoid colon.                    - The examination was otherwise normal.                    - No specimens collected. Recommendation:    - Discharge patient to home.                    - Repeat colonoscopy in 5 years for surveillance.                    - The findings and recommendations were discussed with the                     patient. Procedure Code(s): --- Professional ---                    [phone], Colonoscopy, flexible; diagnostic, including                     collection of specimen(s) by brushing or washing, when                     performed (separate procedure) Diagnosis Code(s): --- Professional ---                    Z80.0, Family history of malignant neoplasm of digestive                     organs                    Z86.010, Personal history of colonic polyps                    K57.30, Diverticulosis of large intestine without                     perforation or abscess without bleeding CPT copyright 2014 American Medical Association. All rights reserved. The codes documented in this report are preliminary and upon coder review may  be revised to meet current compliance requirements. Hulen Luster, MD 03/13/2015 8:25:13 AM This report has been signed electronically. Number of Addenda: 0 Note Initiated On: 03/13/2015 7:56 AM Scope Withdrawal Time: 0 hours 6 minutes 18 seconds  Total Procedure Duration: 0 hours 9 minutes 48 seconds       [facility]

## 2015-03-13 NOTE — Transfer of Care (Signed)
Immediate Anesthesia Transfer of Care Note  Patient: Peter Howard.  Procedure(s) Performed: Procedure(s) with comments: COLONOSCOPY (N/A) - DIABETIC  Patient Location: Endoscopy Unit  Anesthesia Type:General  Level of Consciousness: awake, alert , oriented and patient cooperative  Airway & Oxygen Therapy: Patient Spontanous Breathing and Patient connected to nasal cannula oxygen  Post-op Assessment: Report given to RN, Post -op Vital signs reviewed and stable and Patient moving all extremities X 4  Post vital signs: Reviewed and stable  Last Vitals:  Filed Vitals:   03/13/15 0716  BP: 114/78  Pulse: 67  Temp: 36.4 C  Resp: 12    Complications: No apparent anesthesia complications

## 2015-03-16 NOTE — Anesthesia Postprocedure Evaluation (Signed)
Anesthesia Post Note  Patient: Peter Howard.  Procedure(s) Performed: Procedure(s) (LRB): COLONOSCOPY (N/A)  Patient location during evaluation: PACU Anesthesia Type: General Level of consciousness: combative Pain management: pain level controlled Vital Signs Assessment: post-procedure vital signs reviewed and stable Respiratory status: spontaneous breathing, nonlabored ventilation, respiratory function stable and patient connected to nasal cannula oxygen Cardiovascular status: blood pressure returned to baseline and stable Postop Assessment: no signs of nausea or vomiting Anesthetic complications: no    Last Vitals:  Filed Vitals:   03/13/15 0858 03/13/15 0908  BP: 130/55 116/64  Pulse: 64 61  Temp:    Resp: 23 22    Last Pain:  Filed Vitals:   03/13/15 0909  PainSc: Asleep                 Molli Barrows

## 2016-09-05 ENCOUNTER — Emergency Department: Payer: Medicare PPO

## 2016-09-05 ENCOUNTER — Emergency Department
Admission: EM | Admit: 2016-09-05 | Discharge: 2016-09-06 | Disposition: A | Discharge status: Home / Self Care | Payer: Medicare PPO | Attending: Emergency Medicine | Admitting: Emergency Medicine

## 2016-09-05 DIAGNOSIS — Z79899 Other long term (current) drug therapy: Secondary | ICD-10-CM | POA: Insufficient documentation

## 2016-09-05 DIAGNOSIS — Y939 Activity, unspecified: Secondary | ICD-10-CM | POA: Insufficient documentation

## 2016-09-05 DIAGNOSIS — Z7982 Long term (current) use of aspirin: Secondary | ICD-10-CM | POA: Diagnosis not present

## 2016-09-05 DIAGNOSIS — I10 Essential (primary) hypertension: Secondary | ICD-10-CM | POA: Diagnosis not present

## 2016-09-05 DIAGNOSIS — S161XXA Strain of muscle, fascia and tendon at neck level, initial encounter: Secondary | ICD-10-CM

## 2016-09-05 DIAGNOSIS — Y92411 Interstate highway as the place of occurrence of the external cause: Secondary | ICD-10-CM | POA: Diagnosis not present

## 2016-09-05 DIAGNOSIS — S0990XA Unspecified injury of head, initial encounter: Secondary | ICD-10-CM | POA: Diagnosis present

## 2016-09-05 DIAGNOSIS — E119 Type 2 diabetes mellitus without complications: Secondary | ICD-10-CM | POA: Diagnosis not present

## 2016-09-05 DIAGNOSIS — F17200 Nicotine dependence, unspecified, uncomplicated: Secondary | ICD-10-CM | POA: Insufficient documentation

## 2016-09-05 DIAGNOSIS — Y999 Unspecified external cause status: Secondary | ICD-10-CM | POA: Insufficient documentation

## 2016-09-05 MED ORDER — TRAMADOL HCL 50 MG PO TABS: 50.0000 mg | ORAL_TABLET | Freq: Four times a day (QID) | ORAL | 0 refills | Status: DC | PRN

## 2016-09-05 MED ORDER — TRAMADOL HCL 50 MG PO TABS
50.0000 mg | ORAL_TABLET | Freq: Once | ORAL | Status: AC
  Administered 2016-09-05: 50 mg via ORAL
  Filled 2016-09-05: qty 1
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
## 2016-09-05 NOTE — ED Notes (Addendum)
Pt. Verbalizes understanding of d/c instructions, prescriptions, and follow-up. VS stable. Pt. In NAD at time of d/c and denies further concerns regarding this visit. Pt. Stable at the time of departure from the unit, departing unit ambulatory with shuffling gait, this RN walked pt to car and assisted pt into car d/t fall risk (pt refused wheelchair multiple times as did pt wife). Pt advised to return to the ED at any time for emergent concerns, or for new/worsening symptoms.

## 2016-09-05 NOTE — Discharge Instructions (Signed)
Follow-up with Dr. Sabra Heck for symptoms that are not improving over the next couple of days. Return to the emergency department for any symptom that changes or worsens if unable schedule an appointment.
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
## 2017-09-11 ENCOUNTER — Other Ambulatory Visit: Payer: Self-pay

## 2017-09-11 ENCOUNTER — Emergency Department
Admission: EM | Admit: 2017-09-11 | Discharge: 2017-09-11 | Disposition: A | Discharge status: Home / Self Care | Payer: Medicare PPO | Attending: Emergency Medicine | Admitting: Emergency Medicine

## 2017-09-11 ENCOUNTER — Emergency Department: Payer: Medicare PPO

## 2017-09-11 DIAGNOSIS — I10 Essential (primary) hypertension: Secondary | ICD-10-CM | POA: Diagnosis not present

## 2017-09-11 DIAGNOSIS — J0100 Acute maxillary sinusitis, unspecified: Secondary | ICD-10-CM | POA: Diagnosis not present

## 2017-09-11 DIAGNOSIS — Y9389 Activity, other specified: Secondary | ICD-10-CM | POA: Diagnosis not present

## 2017-09-11 DIAGNOSIS — Y999 Unspecified external cause status: Secondary | ICD-10-CM | POA: Diagnosis not present

## 2017-09-11 DIAGNOSIS — E119 Type 2 diabetes mellitus without complications: Secondary | ICD-10-CM | POA: Insufficient documentation

## 2017-09-11 DIAGNOSIS — Y929 Unspecified place or not applicable: Secondary | ICD-10-CM | POA: Insufficient documentation

## 2017-09-11 DIAGNOSIS — S0990XA Unspecified injury of head, initial encounter: Secondary | ICD-10-CM | POA: Insufficient documentation

## 2017-09-11 DIAGNOSIS — Z8673 Personal history of transient ischemic attack (TIA), and cerebral infarction without residual deficits: Secondary | ICD-10-CM | POA: Diagnosis not present

## 2017-09-11 DIAGNOSIS — F17200 Nicotine dependence, unspecified, uncomplicated: Secondary | ICD-10-CM | POA: Insufficient documentation

## 2017-09-11 DIAGNOSIS — Z7982 Long term (current) use of aspirin: Secondary | ICD-10-CM | POA: Diagnosis not present

## 2017-09-11 DIAGNOSIS — J320 Chronic maxillary sinusitis: Secondary | ICD-10-CM

## 2017-09-11 DIAGNOSIS — W010XXA Fall on same level from slipping, tripping and stumbling without subsequent striking against object, initial encounter: Secondary | ICD-10-CM | POA: Insufficient documentation

## 2017-09-11 LAB — COMPREHENSIVE METABOLIC PANEL
ALT: 8 U/L (ref 0–44)
AST: 17 U/L (ref 15–41)
Albumin: 3.8 g/dL (ref 3.5–5.0)
Alkaline Phosphatase: 58 U/L (ref 38–126)
Anion gap: 8 (ref 5–15)
BILIRUBIN TOTAL: 0.7 mg/dL (ref 0.3–1.2)
BUN: 10 mg/dL (ref 8–23)
CO2: 30 mmol/L (ref 22–32)
CREATININE: 0.81 mg/dL (ref 0.61–1.24)
Calcium: 9.2 mg/dL (ref 8.9–10.3)
Chloride: 100 mmol/L (ref 98–111)
GFR calc Af Amer: >60 mL/min (ref >60)
Glucose, Bld: 102 mg/dL — ABNORMAL HIGH (ref 70–99)
POTASSIUM: 4.3 mmol/L (ref 3.5–5.1)
Sodium: 138 mmol/L (ref 135–145)
TOTAL PROTEIN: 8.1 g/dL (ref 6.5–8.1)

## 2017-09-11 LAB — CBC WITH DIFFERENTIAL/PLATELET
BASOS ABS: 0 10*3/uL (ref 0–0.1)
Basophils Relative: 0 %
EOS ABS: 0.1 10*3/uL (ref 0–0.7)
EOS PCT: 2 %
HCT: 39.7 % — ABNORMAL LOW (ref 40.0–52.0)
Hemoglobin: 13.4 g/dL (ref 13.0–18.0)
Lymphocytes Relative: 26 %
Lymphs Abs: 1.7 10*3/uL (ref 1.0–3.6)
MCH: 32.1 pg (ref 26.0–34.0)
MCHC: 33.8 g/dL (ref 32.0–36.0)
MCV: 94.8 fL (ref 80.0–100.0)
Monocytes Absolute: 1 10*3/uL (ref 0.2–1.0)
Monocytes Relative: 16 %
NEUTROS PCT: 56 %
Neutro Abs: 3.6 10*3/uL (ref 1.4–6.5)
PLATELETS: 269 10*3/uL (ref 150–440)
RBC: 4.18 MIL/uL — AB (ref 4.40–5.90)
RDW: 14.5 % (ref 11.5–14.5)
WBC: 6.5 10*3/uL (ref 3.8–10.6)

## 2017-09-11 LAB — URINALYSIS, COMPLETE (UACMP) WITH MICROSCOPIC
Bilirubin Urine: NEGATIVE
Glucose, UA: NEGATIVE mg/dL
HGB URINE DIPSTICK: NEGATIVE
Ketones, ur: 20 mg/dL — AB
Leukocytes, UA: NEGATIVE
NITRITE: NEGATIVE
PH: 5 (ref 5.0–8.0)
Protein, ur: NEGATIVE mg/dL
SPECIFIC GRAVITY, URINE: 1.023 (ref 1.005–1.030)

## 2017-09-11 LAB — AMMONIA

## 2017-09-11 LAB — TROPONIN I: Troponin I: <0.03 ng/mL (ref <0.03)

## 2017-09-11 MED ORDER — DOXYCYCLINE HYCLATE 100 MG PO CAPS: 100.0000 mg | ORAL_CAPSULE | Freq: Two times a day (BID) | ORAL | 0 refills | Status: DC

## 2017-09-11 NOTE — ED Notes (Signed)
Pt given water for urine sample

## 2017-09-11 NOTE — ED Provider Notes (Signed)
ED ECG REPORT I, Delman Kitten, the attending physician, personally viewed and interpreted this ECG.  Date: 09/11/2017 EKG Time: 1600 Rate: 62 Rhythm: normal sinus rhythm QRS Axis: normal Intervals: normal ST/T Wave abnormalities: normal Narrative Interpretation: no evidence of acute ischemia    Delman Kitten, MD 09/11/17 2030

## 2017-09-11 NOTE — ED Provider Notes (Signed)
ED ECG REPORT I, Rudene Re, the attending physician, personally viewed and interpreted this ECG.  Normal sinus rhythm, rate of 63, normal intervals, normal axis, ST elevation in V2, no other ST elevations, no ST depressions.  Unchanged from prior   [place], [place], MD 09/11/17 1558

## 2017-09-11 NOTE — ED Provider Notes (Signed)
[facility] Emergency Department Provider Note  ____________________________________________   First MD Initiated Contact with Patient 09/11/17 1516     (approximate)  I have reviewed the triage vital signs and the nursing notes.   HISTORY  Chief Complaint Fall and Head Injury   HPI Peter Howard. is a 72 y.o. male who presents to the emergency department for treatment and evaluation after a fall 2 days ago.  Patient states that he was walking out to the car and lost his balance which caused him to fall onto the concrete.  He denies loss of consciousness.  His caretaker states that over the past couple of days, since his fall, he seems to have increased weakness, complains of headache, and states that she has noticed a change in his voice and states that at times she cannot understand what he is saying.  He denies feeling confused and she states that she does not believe he is confused, but states that she just cannot understand what he is saying at times.  He has a 2-year history of frequent falls, but frequency has increased over the past few weeks.  He has been evaluated by both primary care and the [facility] within the past year and has had a physical without any significant changes in diagnoses.  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  He also has a history of hypertension and type 2 diabetes.  He reports compliance with his medication regimen.  Past Medical History:  Diagnosis Date  . BPH (benign prostatic hypertrophy)   . Diabetes mellitus without complication ([place])   . Glaucoma   . Hyperlipidemia   . Hypertension   . Peripheral vascular disease ([place])   . Stroke ([place])   . TIA (transient ischemic attack)   . Tubular adenoma of colon     There are no active problems to display for this patient.   Past Surgical History:  Procedure Laterality Date  . COLONOSCOPY    . COLONOSCOPY N/A 03/13/2015   Procedure: COLONOSCOPY;  Surgeon: Hulen Luster, MD;  Location:  [facility] ENDOSCOPY;  Service: Gastroenterology;  Laterality: N/A;  DIABETIC  . EYE SURGERY     glaucoma  . FOOT SURGERY    . HERNIA REPAIR    . ROTATOR CUFF REPAIR      Prior to Admission medications   Medication Sig Start Date End Date Taking? Authorizing Provider  aspirin EC 81 MG tablet Take 81 mg by mouth daily.    [provider]  bisoprolol-hydrochlorothiazide (ZIAC) 2.5-6.25 MG tablet Take 1 tablet by mouth daily.    [provider]  doxycycline (VIBRAMYCIN) 100 MG capsule Take 1 capsule (100 mg total) by mouth 2 (two) times daily. 09/11/17   Trimaine Maser, Johnette Abraham B, FNP  omeprazole (PRILOSEC) 20 MG capsule Take 20 mg by mouth daily.    [provider]  oxyCODONE-acetaminophen (PERCOCET) 5-325 MG tablet Take 2 tablets by mouth every 6 (six) hours as needed for moderate pain or severe pain. 01/11/15   Earleen Newport, MD  simvastatin (ZOCOR) 20 MG tablet Take 20 mg by mouth daily.    [provider]  tamsulosin (FLOMAX) 0.4 MG CAPS capsule Take 0.4 mg by mouth.    [provider]  traMADol (ULTRAM) 50 MG tablet Take 1 tablet (50 mg total) by mouth every 6 (six) hours as needed. 09/05/16   Victorino Dike, FNP    Allergies Penicillins  History reviewed. No pertinent family history.  Social History Social History   Tobacco Use  . Smoking status: Current Some Day Smoker  . Smokeless tobacco: Never Used  Substance Use Topics  . Alcohol use: Yes  . Drug use: No    Review of Systems  Constitutional: No fever/chills Eyes: Increased watery discharge from the left eye since fall 2 days ago ENT: No sore throat. Cardiovascular: Denies chest pain. Respiratory: Denies shortness of breath. Gastrointestinal: No abdominal pain.  No nausea, no vomiting.  No diarrhea.  No constipation. Genitourinary: Negative for dysuria. Musculoskeletal: Negative for back pain. Skin: Negative for rash. Neurological: Positive for headaches, negative for focal  weakness or numbness. ____________________________________________   PHYSICAL EXAM:  VITAL SIGNS: ED Triage Vitals  Enc Vitals Group     BP 09/11/17 1434 138/77     Pulse Rate 09/11/17 1434 70     Resp 09/11/17 1434 12     Temp 09/11/17 1434 98.5 F (36.9 C)     Temp Source 09/11/17 1434 Oral     SpO2 09/11/17 1434 100 %     Weight 09/11/17 1434 123 lb (55.8 kg)     Height 09/11/17 1434 5\' 8"  (1.727 m)     Head Circumference --      Peak Flow --      Pain Score 09/11/17 1450 10     Pain Loc --      Pain Edu? --      Excl. in [place]? --     Constitutional: Alert and oriented. Well appearing and in no acute distress. Eyes: Conjunctivae are normal. PERRL. EOMI. Head: Hematoma noted to the left occipital scalp area without open wound or lesion. Nose: No congestion/rhinnorhea. Mouth/Throat: Mucous membranes are moist.  Oropharynx non-erythematous. Neck: No stridor.  No focal midline tenderness on exam. Cardiovascular: Normal rate, regular rhythm. Grossly normal heart sounds.  Good peripheral circulation. Respiratory: Normal respiratory effort.  No retractions. Lungs CTAB. Gastrointestinal: Soft and nontender. No distention. No abdominal bruits. No CVA tenderness. Musculoskeletal: No lower extremity tenderness nor edema.  No joint effusions. Neurologic:  Normal speech and language. No gross focal neurologic deficits are appreciated. No gait instability. Skin:  Skin is warm, dry and intact. No rash noted. Psychiatric: Mood and affect are normal. Speech and behavior are normal.  ____________________________________________   LABS (all labs ordered are listed, but only abnormal results are displayed)  Labs Reviewed  COMPREHENSIVE METABOLIC PANEL - Abnormal; Notable for the following components:      Result Value   Glucose, Bld 102 (*)    All other components within normal limits  CBC WITH DIFFERENTIAL/PLATELET - Abnormal; Notable for the following components:   RBC 4.18 (*)     HCT 39.7 (*)    All other components within normal limits  AMMONIA - Abnormal; Notable for the following components:   Ammonia <9 (*)    All other components within normal limits  URINALYSIS, COMPLETE (UACMP) WITH MICROSCOPIC - Abnormal; Notable for the following components:   Color, Urine YELLOW (*)    APPearance CLEAR (*)    Ketones, ur 20 (*)    Bacteria, UA RARE (*)    All other components within normal limits  TROPONIN I   ____________________________________________  EKG   Date: 09/12/2017  Rate: 62  Rhythm: normal sinus rhythm  QRS Axis: normal  Intervals: normal  ST/T Wave abnormalities: normal  Conduction Disutrbances: none  Narrative Interpretation: Normal sinus rhythm    ____________________________________________  RADIOLOGY  ED MD interpretation: Sinusitis, otherwise negative  Official radiology report(s): Ct Head Wo Contrast  Result Date: 09/11/2017 CLINICAL DATA:  pt states he fell 2 days ago. Pt unsure of LOC. Pt with head and neck pain now. Pt with hx of stroke. EXAM: CT HEAD WITHOUT CONTRAST CT CERVICAL SPINE WITHOUT CONTRAST TECHNIQUE: Multidetector CT imaging of the head and cervical spine was performed following the standard protocol without intravenous contrast. Multiplanar CT image reconstructions of the cervical spine were also generated. COMPARISON:  09/05/2016 FINDINGS: CT HEAD FINDINGS Brain: No evidence of acute infarction, hemorrhage, hydrocephalus, extra-axial collection or mass lesion/mass effect. There is mild, age-appropriate, ventricular sulcal enlargement. Mild patchy white matter hypoattenuation is also noted consistent with chronic microvascular ischemic change. Vascular: No hyperdense vessel or unexpected calcification. Skull: Normal. Negative for fracture or focal lesion. Sinuses/Orbits: Near complete opacification of the left ethmoid air cells. Near complete opacification of the left frontal sinus with some dependent fluid. Mild anterior  right ethmoid air cell mucosal thickening. Mild anterior sphenoid sinus mucosal thickening. Other: None. CT CERVICAL SPINE FINDINGS Alignment: Normal. Skull base and vertebrae: No acute fracture. No primary bone lesion or focal pathologic process. Soft tissues and spinal canal: No prevertebral fluid or swelling. No visible canal hematoma. Disc levels: Moderate loss of disc height with endplate sclerosis and osteophytes at C5-C6. Mild loss of disc height at C3-C4 and C6-C7. No convincing disc herniation. Upper chest: No acute findings.  Clear lung apices. Other: None. IMPRESSION: HEAD CT 1. No acute intracranial abnormalities. 2. Age-appropriate volume loss. Mild chronic microvascular ischemic change. 3. Significant left-sided sinus disease. Consider acute sinusitis in the proper clinical setting. CERVICAL CT 1. No fracture or acute finding. Electronically Signed   By: Lajean Manes M.D.   On: 09/11/2017 15:50   Ct Cervical Spine Wo Contrast  Result Date: 09/11/2017 CLINICAL DATA:  pt states he fell 2 days ago. Pt unsure of LOC. Pt with head and neck pain now. Pt with hx of stroke. EXAM: CT HEAD WITHOUT CONTRAST CT CERVICAL SPINE WITHOUT CONTRAST TECHNIQUE: Multidetector CT imaging of the head and cervical spine was performed following the standard protocol without intravenous contrast. Multiplanar CT image reconstructions of the cervical spine were also generated. COMPARISON:  09/05/2016 FINDINGS: CT HEAD FINDINGS Brain: No evidence of acute infarction, hemorrhage, hydrocephalus, extra-axial collection or mass lesion/mass effect. There is mild, age-appropriate, ventricular sulcal enlargement. Mild patchy white matter hypoattenuation is also noted consistent with chronic microvascular ischemic change. Vascular: No hyperdense vessel or unexpected calcification. Skull: Normal. Negative for fracture or focal lesion. Sinuses/Orbits: Near complete opacification of the left ethmoid air cells. Near complete  opacification of the left frontal sinus with some dependent fluid. Mild anterior right ethmoid air cell mucosal thickening. Mild anterior sphenoid sinus mucosal thickening. Other: None. CT CERVICAL SPINE FINDINGS Alignment: Normal. Skull base and vertebrae: No acute fracture. No primary bone lesion or focal pathologic process. Soft tissues and spinal canal: No prevertebral fluid or swelling. No visible canal hematoma. Disc levels: Moderate loss of disc height with endplate sclerosis and osteophytes at C5-C6. Mild loss of disc height at C3-C4 and C6-C7. No convincing disc herniation. Upper chest: No acute findings.  Clear lung apices. Other: None. IMPRESSION: HEAD CT 1. No acute intracranial abnormalities. 2. Age-appropriate volume loss. Mild chronic microvascular ischemic change. 3. Significant left-sided sinus disease. Consider acute sinusitis in the proper clinical setting. CERVICAL CT 1. No fracture or acute finding. Electronically Signed   By: Lajean Manes M.D.   On: 09/11/2017 15:50    ____________________________________________  PROCEDURES  Procedure(s) performed: None  Procedures  Critical Care performed: No  ____________________________________________   INITIAL IMPRESSION / ASSESSMENT AND PLAN / ED COURSE  As part of my medical decision making, I reviewed the following data within the electronic MEDICAL RECORD NUMBER    Very pleasant 72 year old male presenting to the emergency department for treatment and evaluation after a fall 2 days ago where he lost his balance and fell, striking his head on the concrete.  Images and lab studies have been requested.  Differential diagnosis includes but is not limited to minor head injury, electrolyte disturbance, ICH, acute cystitis, or gait disturbance secondary to dementia.  CT of the head and cervical spine are negative for acute bony abnormality, however there is a significant amount of sinusitis noted on the left frontal sinus as well as the  right ethmoid and sphenoid sinus.  The patient will be treated with doxycycline as he is allergic to penicillin.  While here, he was given a sandwich tray and was able to eat the entire tray without complaints.  He was advised that he would need to schedule follow-up appointment with his primary care provider for further evaluation of frequent falls.  He was encouraged to return to the emergency department for symptoms of change or worsen if unable to schedule an appointment. _________________________________________   FINAL CLINICAL IMPRESSION(S) / ED DIAGNOSES  Final diagnoses:  Minor head injury, initial encounter  Maxillary sinusitis, unspecified chronicity     ED Discharge Orders        Ordered    doxycycline (VIBRAMYCIN) 100 MG capsule  2 times daily     09/11/17 1742       Note:  This document was prepared using Dragon voice recognition software and may include unintentional dictation errors.    Victorino Dike, FNP 09/12/17 2122    Delman Kitten, MD 09/12/17 [phone]

## 2017-09-11 NOTE — ED Triage Notes (Signed)
Pt states 2 days ago he fell and hit head. Denies LOC. Denies blood thinners. C/o L sided head pain. Alert, oriented.

## 2018-04-12 ENCOUNTER — Emergency Department: Payer: Medicare PPO

## 2018-04-12 ENCOUNTER — Emergency Department
Admission: EM | Admit: 2018-04-12 | Discharge: 2018-04-12 | Disposition: A | Discharge status: Home / Self Care | Payer: Medicare PPO | Attending: Emergency Medicine | Admitting: Emergency Medicine

## 2018-04-12 ENCOUNTER — Other Ambulatory Visit: Payer: Self-pay

## 2018-04-12 ENCOUNTER — Encounter: Payer: Self-pay | Admitting: Emergency Medicine

## 2018-04-12 DIAGNOSIS — I10 Essential (primary) hypertension: Secondary | ICD-10-CM | POA: Diagnosis not present

## 2018-04-12 DIAGNOSIS — R42 Dizziness and giddiness: Secondary | ICD-10-CM | POA: Insufficient documentation

## 2018-04-12 DIAGNOSIS — E119 Type 2 diabetes mellitus without complications: Secondary | ICD-10-CM | POA: Diagnosis not present

## 2018-04-12 DIAGNOSIS — Y998 Other external cause status: Secondary | ICD-10-CM | POA: Insufficient documentation

## 2018-04-12 DIAGNOSIS — Z79899 Other long term (current) drug therapy: Secondary | ICD-10-CM | POA: Diagnosis not present

## 2018-04-12 DIAGNOSIS — Y92810 Car as the place of occurrence of the external cause: Secondary | ICD-10-CM | POA: Diagnosis not present

## 2018-04-12 DIAGNOSIS — Z8673 Personal history of transient ischemic attack (TIA), and cerebral infarction without residual deficits: Secondary | ICD-10-CM | POA: Diagnosis not present

## 2018-04-12 DIAGNOSIS — Y9389 Activity, other specified: Secondary | ICD-10-CM | POA: Diagnosis not present

## 2018-04-12 DIAGNOSIS — W01198A Fall on same level from slipping, tripping and stumbling with subsequent striking against other object, initial encounter: Secondary | ICD-10-CM | POA: Insufficient documentation

## 2018-04-12 DIAGNOSIS — Z7982 Long term (current) use of aspirin: Secondary | ICD-10-CM | POA: Diagnosis not present

## 2018-04-12 DIAGNOSIS — F17210 Nicotine dependence, cigarettes, uncomplicated: Secondary | ICD-10-CM | POA: Diagnosis not present

## 2018-04-12 DIAGNOSIS — S0990XA Unspecified injury of head, initial encounter: Secondary | ICD-10-CM

## 2018-04-12 LAB — GLUCOSE, CAPILLARY
Glucose-Capillary: 83 mg/dL (ref 70–99)
Glucose-Capillary: 70 mg/dL (ref 70–99)

## 2018-04-12 NOTE — ED Notes (Signed)
Pt states he became dizzy, fell back and hit his head. Pt states episode lasted approx several minutes. Pt's family member also states that pt got up and was walking up ramp and fell again. No obvious bruising to back of head. Pt is alert and oriented at baseline.

## 2018-04-12 NOTE — Discharge Instructions (Addendum)
To the emergency room for any new or worrisome symptoms, we do notice that you have sinusitis, please let your doctor know.  This is a chronic not acute problem.  If you have persistent vomiting, numbness, weakness, chest pain, or you feel worse in any way please return to the ER.  Be very careful with alcohol as it does increase your risk of falling.  Continue to stop smoking.

## 2018-04-12 NOTE — ED Provider Notes (Signed)
[facility] Emergency Department Provider Note  ____________________________________________   I have reviewed the triage vital signs and the nursing notes. Where available I have reviewed prior notes and, if possible and indicated, outside hospital notes.    HISTORY  Chief Complaint Fall and Head Injury    HPI Peter Howard. is a 73 y.o. male who was coming home from a funeral on Friday, had had some alcohol, stood up too quickly out of the car and tripped, fell, hit his head.  Did not pass out remembers the fall was with family, it was witnessed.  Apparently, he was fine at night and most of the next day but then he began to have some slight headaches and family were worried that his head injury might of been worse.  He sometimes states he feels a little bit dizzy after his fall.  He denies chest pain shortness of breath nausea vomiting.  He also has some pain to his left hip.  At this time, family feels that he is at his baseline.  He did hit his head on the concrete apparently. Was in his normal state of health before the fall except for having had a little to drink, and he has had no focal numbness or weakness aside from his known left-sided weakness from prior stroke.  Is able to ambulate but his left hip is somewhat discomforting to him.   Past Medical History:  Diagnosis Date  . BPH (benign prostatic hypertrophy)   . Diabetes mellitus without complication ([place])   . Glaucoma   . Hyperlipidemia   . Hypertension   . Peripheral vascular disease ([place])   . Stroke ([place])   . TIA (transient ischemic attack)   . Tubular adenoma of colon     There are no active problems to display for this patient.   Past Surgical History:  Procedure Laterality Date  . COLONOSCOPY    . COLONOSCOPY N/A 03/13/2015   Procedure: COLONOSCOPY;  Surgeon: Hulen Luster, MD;  Location: [facility] ENDOSCOPY;  Service: Gastroenterology;  Laterality: N/A;  DIABETIC  . EYE SURGERY      glaucoma  . FOOT SURGERY    . HERNIA REPAIR    . ROTATOR CUFF REPAIR      Prior to Admission medications   Medication Sig Start Date End Date Taking? Authorizing Provider  aspirin EC 81 MG tablet Take 81 mg by mouth daily.    [provider]  bisoprolol-hydrochlorothiazide (ZIAC) 2.5-6.25 MG tablet Take 1 tablet by mouth daily.    [provider]  doxycycline (VIBRAMYCIN) 100 MG capsule Take 1 capsule (100 mg total) by mouth 2 (two) times daily. 09/11/17   Triplett, Johnette Abraham B, FNP  omeprazole (PRILOSEC) 20 MG capsule Take 20 mg by mouth daily.    [provider]  oxyCODONE-acetaminophen (PERCOCET) 5-325 MG tablet Take 2 tablets by mouth every 6 (six) hours as needed for moderate pain or severe pain. 01/11/15   Earleen Newport, MD  simvastatin (ZOCOR) 20 MG tablet Take 20 mg by mouth daily.    [provider]  tamsulosin (FLOMAX) 0.4 MG CAPS capsule Take 0.4 mg by mouth.    [provider]  traMADol (ULTRAM) 50 MG tablet Take 1 tablet (50 mg total) by mouth every 6 (six) hours as needed. 09/05/16   Sherrie George B, FNP    Allergies Penicillins  No family history on file.  Social History Social History   Tobacco Use  . Smoking status: Current  Some Day Smoker    Types: Cigarettes  . Smokeless tobacco: Never Used  Substance Use Topics  . Alcohol use: Yes  . Drug use: No    Review of Systems Constitutional: No fever/chills Eyes: No visual changes. ENT: No sore throat. No stiff neck no neck pain Cardiovascular: Denies chest pain. Respiratory: Denies shortness of breath. Gastrointestinal:   no vomiting.  No diarrhea.  No constipation. Genitourinary: Negative for dysuria. Musculoskeletal: Negative lower extremity swelling Skin: Negative for rash. Neurological: Negative for severe headaches, focal weakness or numbness.   ____________________________________________   PHYSICAL EXAM:  VITAL SIGNS: ED Triage Vitals  Enc Vitals  Group     BP 04/12/18 1650 (!) 152/90     Pulse Rate 04/12/18 1650 63     Resp 04/12/18 1650 16     Temp 04/12/18 1650 98.5 F (36.9 C)     Temp Source 04/12/18 1650 Oral     SpO2 04/12/18 1650 100 %     Weight 04/12/18 1652 124 lb (56.2 kg)     Height 04/12/18 1652 5\' 8"  (1.727 m)     Head Circumference --      Peak Flow --      Pain Score 04/12/18 1651 8     Pain Loc --      Pain Edu? --      Excl. in [place]? --     Constitutional: Alert and oriented. Well appearing and in no acute distress. Eyes: Conjunctivae are normal Head: Atraumatic HEENT: No congestion/rhinnorhea. Mucous membranes are moist.  Oropharynx non-erythematous Neck:   Nontender with no meningismus, no masses, no stridor Cardiovascular: Normal rate, regular rhythm. Grossly normal heart sounds.  Good peripheral circulation. Respiratory: Normal respiratory effort.  No retractions. Lungs CTAB. Abdominal: Soft and nontender. No distention. No guarding no rebound Back:  There is no focal tenderness or step off.  there is no midline tenderness there are no lesions noted. there is no CVA tenderness  Musculoskeletal: No lower extremity tenderness aside from some minimal tenderness to palpation to the left hip which I can range however with no significant discomfort, no upper extremity tenderness. No joint effusions, no DVT signs strong distal pulses no edema Neurologic:  Normal speech and language.  With left lower extremity weakness which is reported to be chronic Skin:  Skin is warm, dry and intact. No rash noted. Psychiatric: Mood and affect are normal. Speech and behavior are normal.  ____________________________________________   LABS (all labs ordered are listed, but only abnormal results are displayed)  Labs Reviewed - No data to display  Pertinent labs  results that were available during my care of the patient were reviewed by me and considered in my medical decision making (see chart for  details). ____________________________________________  EKG  I personally interpreted any EKGs ordered by me or triage  ____________________________________________  RADIOLOGY  Pertinent labs & imaging results that were available during my care of the patient were reviewed by me and considered in my medical decision making (see chart for details). If possible, patient and/or family made aware of any abnormal findings.  Ct Head Wo Contrast  Result Date: 04/12/2018 CLINICAL DATA:  Mechanical fall 04/07/18. Dizziness, blurred vision, intermittent confusion. EXAM: CT HEAD WITHOUT CONTRAST TECHNIQUE: Contiguous axial images were obtained from the base of the skull through the vertex without intravenous contrast. COMPARISON:  09/11/2017. FINDINGS: Brain: No evidence for acute infarction, hemorrhage, mass lesion, hydrocephalus, or extra-axial fluid. Generalized atrophy. Hypoattenuation of white matter, likely small vessel  disease. Vascular: Calcification of the cavernous internal carotid arteries consistent with cerebrovascular atherosclerotic disease. No signs of intracranial large vessel occlusion. Skull: Normal. Negative for fracture or focal lesion. Sinuses/Orbits: Significant LEFT frontoethmoid sinus disease, similar to priors. Probable LEFT ostiomeatal unit narrowing or obstruction. No orbital findings of significance. Other: None. IMPRESSION: Atrophy and small vessel disease. No acute intracranial findings. Similar appearance to priors. Chronic LEFT frontoethmoid sinus fluid. Consider elective evaluation with CT sinus. Electronically Signed   By: Staci Righter M.D.   On: 04/12/2018 17:36   ____________________________________________    PROCEDURES  Procedure(s) performed: None  Procedures  Critical Care performed: None  ____________________________________________   INITIAL IMPRESSION / ASSESSMENT AND PLAN / ED COURSE  Pertinent labs & imaging results that were available during my care  of the patient were reviewed by me and considered in my medical decision making (see chart for details).  Here with a non-syncopal fall couple days ago where he cracked his head pretty hard on the ground.  Has had some postconcussive-like symptoms since that time.  Seems to be getting better rather than worse however today.  CT scan is negative, we will check a sugar on him, he has no focal complaint otherwise any is at his baseline neurologically in all respects at this time.  Left hip is somewhat tender, low suspicion for fracture but we will x-ray it, and we will reassess.  Does appear to be most likely postconcussive syndrome fortunately there is no evidence of bleed and he is several days out    ____________________________________________   FINAL CLINICAL IMPRESSION(S) / ED DIAGNOSES  Final diagnoses:  None      This chart was dictated using voice recognition software.  Despite best efforts to proofread,  errors can occur which can change meaning.      Schuyler Amor, MD 04/12/18 [phone]

## 2018-04-12 NOTE — ED Triage Notes (Addendum)
Pt in via POV, reports mechanical fall on Friday 2/7, hitting head on concrete drive way, was not evaluated at time of fall.  Pt unable to recall the fall, since patient has developed dizziness, blurred vision, intermittent confusion; states symptoms began Monday.  Pt denies any changes to speech.  Pt with left side weakness at baseline due to previous CVA.  Pt denies LOC at time of fall, denies blood thinners.  Pt A/Ox4, vitals WDL.

## 2018-04-12 NOTE — ED Notes (Signed)
Pt presentation discussed with EDP, McShane.  See new orders.

## 2018-04-23 ENCOUNTER — Other Ambulatory Visit: Payer: Self-pay

## 2018-04-23 ENCOUNTER — Emergency Department
Admission: EM | Admit: 2018-04-23 | Discharge: 2018-04-23 | Disposition: A | Discharge status: Home / Self Care | Payer: Medicare PPO | Attending: Emergency Medicine | Admitting: Emergency Medicine

## 2018-04-23 ENCOUNTER — Emergency Department: Payer: Medicare PPO

## 2018-04-23 DIAGNOSIS — E119 Type 2 diabetes mellitus without complications: Secondary | ICD-10-CM | POA: Diagnosis not present

## 2018-04-23 DIAGNOSIS — R531 Weakness: Secondary | ICD-10-CM

## 2018-04-23 DIAGNOSIS — Z79899 Other long term (current) drug therapy: Secondary | ICD-10-CM | POA: Diagnosis not present

## 2018-04-23 DIAGNOSIS — Z7982 Long term (current) use of aspirin: Secondary | ICD-10-CM | POA: Diagnosis not present

## 2018-04-23 DIAGNOSIS — F17210 Nicotine dependence, cigarettes, uncomplicated: Secondary | ICD-10-CM | POA: Insufficient documentation

## 2018-04-23 DIAGNOSIS — I10 Essential (primary) hypertension: Secondary | ICD-10-CM | POA: Diagnosis not present

## 2018-04-23 DIAGNOSIS — Z8673 Personal history of transient ischemic attack (TIA), and cerebral infarction without residual deficits: Secondary | ICD-10-CM | POA: Insufficient documentation

## 2018-04-23 DIAGNOSIS — W19XXXA Unspecified fall, initial encounter: Secondary | ICD-10-CM

## 2018-04-23 LAB — URINALYSIS, COMPLETE (UACMP) WITH MICROSCOPIC
BILIRUBIN URINE: NEGATIVE
Bacteria, UA: NONE SEEN
GLUCOSE, UA: NEGATIVE mg/dL
Hgb urine dipstick: NEGATIVE
KETONES UR: NEGATIVE mg/dL
LEUKOCYTE UA: NEGATIVE
NITRITE: NEGATIVE
PH: 5 (ref 5.0–8.0)
Protein, ur: NEGATIVE mg/dL
Specific Gravity, Urine: 1.021 (ref 1.005–1.030)

## 2018-04-23 LAB — BASIC METABOLIC PANEL
ANION GAP: 6 (ref 5–15)
BUN: 8 mg/dL (ref 8–23)
CO2: 28 mmol/L (ref 22–32)
Calcium: 9.1 mg/dL (ref 8.9–10.3)
Chloride: 106 mmol/L (ref 98–111)
Creatinine, Ser: 0.79 mg/dL (ref 0.61–1.24)
GFR calc non Af Amer: >60 mL/min (ref >60)
Glucose, Bld: 100 mg/dL — ABNORMAL HIGH (ref 70–99)
Potassium: 4 mmol/L (ref 3.5–5.1)
SODIUM: 140 mmol/L (ref 135–145)

## 2018-04-23 LAB — CBC
HCT: 46.2 % (ref 39.0–52.0)
Hemoglobin: 14.8 g/dL (ref 13.0–17.0)
MCH: 31.8 pg (ref 26.0–34.0)
MCHC: 32 g/dL (ref 30.0–36.0)
MCV: 99.1 fL (ref 80.0–100.0)
NRBC: 0 % (ref 0.0–0.2)
Platelets: 199 10*3/uL (ref 150–400)
RBC: 4.66 MIL/uL (ref 4.22–5.81)
RDW: 14.8 % (ref 11.5–15.5)
WBC: 5 10*3/uL (ref 4.0–10.5)

## 2018-04-23 NOTE — ED Notes (Signed)
Discharge instructions reviewed with patient. Questions fielded by this RN. Patient verbalizes understanding of instructions. Patient discharged home in stable condition per Paduchowski. No acute distress noted at time of discharge.

## 2018-04-23 NOTE — ED Provider Notes (Signed)
[facility] Emergency Department Provider Note  Time seen: 10:05 PM  I have reviewed the triage vital signs and the nursing notes.   HISTORY  Chief Complaint Fall   HPI Peter Howard. is a 73 y.o. male a past medical history of diabetes, hypertension, hyperlipidemia, prior CVA/TIA, presents to the emergency department for intermittent dizziness and weakness x3 weeks.  According to the patient and wife patient had a fall approximately 3 weeks ago in which she hit the back of his head.  Ever since patient states he will occasionally get dizzy which he describes as a spinning or lightheaded sensation, that passes after a few moments.  Also states he has intermittently been feeling weak as well although denies any significant weakness currently.  Denies any nausea vomiting or diarrhea.  No dysuria.  No focal weakness or numbness.   Past Medical History:  Diagnosis Date  . BPH (benign prostatic hypertrophy)   . Diabetes mellitus without complication ([place])   . Glaucoma   . Hyperlipidemia   . Hypertension   . Peripheral vascular disease ([place])   . Stroke ([place])   . TIA (transient ischemic attack)   . Tubular adenoma of colon     There are no active problems to display for this patient.   Past Surgical History:  Procedure Laterality Date  . COLONOSCOPY    . COLONOSCOPY N/A 03/13/2015   Procedure: COLONOSCOPY;  Surgeon: Hulen Luster, MD;  Location: [facility] ENDOSCOPY;  Service: Gastroenterology;  Laterality: N/A;  DIABETIC  . EYE SURGERY     glaucoma  . FOOT SURGERY    . HERNIA REPAIR    . ROTATOR CUFF REPAIR      Prior to Admission medications   Medication Sig Start Date End Date Taking? Authorizing Provider  aspirin EC 81 MG tablet Take 81 mg by mouth daily.    [provider]  bisoprolol-hydrochlorothiazide (ZIAC) 2.5-6.25 MG tablet Take 1 tablet by mouth daily.    [provider]  doxycycline (VIBRAMYCIN) 100 MG capsule Take 1 capsule (100  mg total) by mouth 2 (two) times daily. 09/11/17   Triplett, Johnette Abraham B, FNP  omeprazole (PRILOSEC) 20 MG capsule Take 20 mg by mouth daily.    [provider]  oxyCODONE-acetaminophen (PERCOCET) 5-325 MG tablet Take 2 tablets by mouth every 6 (six) hours as needed for moderate pain or severe pain. 01/11/15   Earleen Newport, MD  simvastatin (ZOCOR) 20 MG tablet Take 20 mg by mouth daily.    [provider]  tamsulosin (FLOMAX) 0.4 MG CAPS capsule Take 0.4 mg by mouth.    [provider]  traMADol (ULTRAM) 50 MG tablet Take 1 tablet (50 mg total) by mouth every 6 (six) hours as needed. 09/05/16   Victorino Dike, FNP    Allergies  Allergen Reactions  . Penicillins Hives    No family history on file.  Social History Social History   Tobacco Use  . Smoking status: Current Some Day Smoker    Types: Cigarettes  . Smokeless tobacco: Never Used  Substance Use Topics  . Alcohol use: Yes  . Drug use: No    Review of Systems Constitutional: Negative for fever. Cardiovascular: Negative for chest pain. Respiratory: Negative for shortness of breath. Gastrointestinal: Negative for abdominal pain Genitourinary: Negative for urinary compaints Musculoskeletal: Negative for musculoskeletal complaints Skin: Negative for skin complaints  Neurological: Negative for headache All other ROS negative  ____________________________________________   PHYSICAL EXAM:  VITAL  SIGNS: ED Triage Vitals  Enc Vitals Group     BP 04/23/18 1802 (!) 156/87     Pulse Rate 04/23/18 1802 72     Resp 04/23/18 1802 19     Temp 04/23/18 1802 97.9 F (36.6 C)     Temp src --      SpO2 04/23/18 1802 100 %     Weight 04/23/18 1803 124 lb (56.2 kg)     Height 04/23/18 1803 5\' 8"  (1.727 m)     Head Circumference --      Peak Flow --      Pain Score 04/23/18 1803 5     Pain Loc --      Pain Edu? --      Excl. in [place]? --    Constitutional: Alert, no acute distress.  Well appearing   Eyes: Normal exam ENT   Head: Normocephalic and atraumatic.  No signs of trauma.   Mouth/Throat: Mucous membranes are moist. Cardiovascular: Normal rate, regular rhythm.  Respiratory: Normal respiratory effort without tachypnea nor retractions. Breath sounds are clear Gastrointestinal: Soft and nontender. No distention.  Musculoskeletal: Nontender with normal range of motion in all extremities Neurologic:  Normal speech and language. No gross focal neurologic deficits.  Equal grip strength bilaterally.  No pronator drift.  No cranial nerve deficits.  Patient does have slight decrease strength in the right lower extremity although states this is chronic ever since he injured the leg. Skin:  Skin is warm, dry and intact.  Psychiatric: Mood and affect are normal.  ____________________________________________    EKG  EKG viewed and interpreted by myself shows a normal sinus rhythm at 70 bpm with a narrow QRS, normal axis, normal intervals, nonspecific but no concerning ST changes.  ____________________________________________    RADIOLOGY  IMPRESSION: 1. No evidence of acute intracranial abnormality. No evidence of calvarial fracture. 2. Generalized cerebral volume loss and mild chronic small vessel ischemic changes in the cerebral white matter. 3. Chronic paranasal sinusitis.  ____________________________________________   INITIAL IMPRESSION / ASSESSMENT AND PLAN / ED COURSE  Pertinent labs & imaging results that were available during my care of the patient were reviewed by me and considered in my medical decision making (see chart for details).  Patient presents to the emergency department for intermittent dizziness and weakness ever since a fall 3 weeks ago in which the patient hit his head.  Differential would include subdural hematoma or other intracranial abnormality, postconcussive syndrome, vertigo, metabolic abnormality, dehydration.  We will check labs,  urinalysis, CT scan head and continue to closely monitor.  Patient is EKG, CT and blood work are largely within normal limits, urinalysis is pending.  Urinalysis is normal.  We will discharge patient home with PCP follow-up.  Patient and wife agreeable to plan of care.  ____________________________________________   FINAL CLINICAL IMPRESSION(S) / ED DIAGNOSES  Dizziness Weakness   Harvest Dark, MD 04/23/18 2252

## 2018-04-23 NOTE — ED Triage Notes (Addendum)
Pt comes via POV from home with c/o fall. Pt states he can't get out of bed because he keeps falling.  Pt denies any weakness. Pt states he hasn't been able to eat. Pt states dizziness and feels the room is spinning.  Pt states he hit his head on concrete last Friday and was not seen at ED.  Pt denies any blood thinners. Pt states bad headache and some visual disturbances.

## 2018-10-24 ENCOUNTER — Ambulatory Visit: Payer: Self-pay | Admitting: Surgery

## 2018-10-24 NOTE — H&P (View-Only) (Signed)
Subjective:  CC: Bilateral inguinal hernia without obstruction or gangrene, recurrence not specified [K40.20]  HPI:  Peter Howard is a 73 y.o. male who was referred by Yevonne Pax, MD for evaluation of above. Initially noted some time ago. Pain is achy and intermittent, confined to the left groin and right groin, without radiation. Associated with nothing specific, exacerbated by exertion. Lump is reducible. Patient has no symptoms of chronic cough, difficulty urinating.  Past Medical History: has a past medical history of BPH (benign prostatic hypertrophy), Diverticulosis (03/13/2015), DM (diabetes mellitus) (CMS-HCC) (09/27/2013), Glaucoma, Hyperlipidemia, Hypertension, PVD (peripheral vascular disease) (CMS-HCC), Stroke (CMS-HCC), TIA (transient ischemic attack) (09/2012), and Tubular adenoma of colon, unspecified (11/04/2009).  Past Surgical History:       Past Surgical History:  Procedure Laterality Date  . COLONOSCOPY  11/04/2009  . COLONOSCOPY  03/13/2015   Diverticulosis/Otherwise normal/PHx of colon polyps/FHx of Colon cancer-Mother/Repeat 41yrs/PYO  . Foot surgery    . GLAUCOMA EYE SURGERY    . HERNIA REPAIR Left   . Rotator cuff repair Right   Family History: family history includes Cancer in his father and mother; Colon cancer in his mother; Diabetes in an other family member.  Social History: reports that he has been smoking cigarettes. He has never used smokeless tobacco. He reports current alcohol use. He reports that he does not use drugs.  Current Medications: has a current medication list which includes the following prescription(s): blood glucose control, normal, blood-glucose meter, lancets, oxycodone-acetaminophen, and tamsulosin.  Allergies:       Allergies as of 10/24/2018 - Reviewed 10/24/2018  Allergen Reaction Noted  . Penicillins Hives 09/13/2013  ROS:  A 15 point review of systems was performed and pertinent positives and negatives noted in HPI  Objective:   BP 143/88  Pulse 80  Ht 171.5 cm (5' 7.5")  Wt 58.1 kg (128 lb)  BMI 19.75 kg/m  Constitutional :  alert, appears stated age, cooperative and no distress  Lymphatics/Throat:  no asymmetry, masses, or scars  Respiratory:  clear to auscultation bilaterally  Cardiovascular:  regular rate and rhythm  Gastrointestinal: soft, non-tender; bowel sounds normal; no masses, no organomegaly. inguinal hernia noted. moderate, reducible and bilateral umbilical hernia noted, reducible, non-tender  Musculoskeletal: Steady gait and movement  Skin: Cool and moist,   Psychiatric: Normal affect, non-agitated, not confused     LABS:  n/a  RADS:  n/a  Assessment:  Bilateral inguinal hernia without obstruction or gangrene, recurrence not specified [K40.20]  Initial on right, recurrent on left (hx of open repair)  Umbilical hernia, reducible  Plan:  1. Bilateral inguinal hernia without obstruction or gangrene, recurrence not specified [K40.20]  Discussed the risk of surgery including recurrence, which can be up to 50% in the case of incisional or complex hernias, possible use of prosthetic materials (mesh) and the increased risk of mesh infxn if used, bleeding, chronic pain, post-op infxn, post-op SBO or ileus, and possible re-operation to address said risks. The risks of general anesthetic, if used, includes MI, CVA, sudden death or even reaction to anesthetic medications also discussed. Alternatives include continued observation. Benefits include possible symptom relief, prevention of incarceration, strangulation, enlargement in size over time, and the risk of emergency surgery in the face of strangulation.  Typical post-op recovery time of 3-5 days with 4-6 weeks of activity restrictions were also discussed.  ED return precautions given for sudden increase in pain, size of hernia with accompanying fever, nausea, and/or vomiting.  The patient verbalized understanding and  all questions were answered to the  patient's satisfaction.  2. Patient has elected to proceed with surgical treatment. Procedure will be scheduled. Robotic assisted bilateral Written consent was obtained. Will attempt umbilical hernia repair at same time if possible.

## 2018-10-24 NOTE — H&P (Signed)
Subjective:  CC: Bilateral inguinal hernia without obstruction or gangrene, recurrence not specified [K40.20]  HPI:  Peter Howard is a 73 y.o. male who was referred by Yevonne Pax, MD for evaluation of above. Initially noted some time ago. Pain is achy and intermittent, confined to the left groin and right groin, without radiation. Associated with nothing specific, exacerbated by exertion. Lump is reducible. Patient has no symptoms of chronic cough, difficulty urinating.  Past Medical History: has a past medical history of BPH (benign prostatic hypertrophy), Diverticulosis (03/13/2015), DM (diabetes mellitus) (CMS-HCC) (09/27/2013), Glaucoma, Hyperlipidemia, Hypertension, PVD (peripheral vascular disease) (CMS-HCC), Stroke (CMS-HCC), TIA (transient ischemic attack) (09/2012), and Tubular adenoma of colon, unspecified (11/04/2009).  Past Surgical History:       Past Surgical History:  Procedure Laterality Date  . COLONOSCOPY  11/04/2009  . COLONOSCOPY  03/13/2015   Diverticulosis/Otherwise normal/PHx of colon polyps/FHx of Colon cancer-Mother/Repeat 26yrs/PYO  . Foot surgery    . GLAUCOMA EYE SURGERY    . HERNIA REPAIR Left   . Rotator cuff repair Right   Family History: family history includes Cancer in his father and mother; Colon cancer in his mother; Diabetes in an other family member.  Social History: reports that he has been smoking cigarettes. He has never used smokeless tobacco. He reports current alcohol use. He reports that he does not use drugs.  Current Medications: has a current medication list which includes the following prescription(s): blood glucose control, normal, blood-glucose meter, lancets, oxycodone-acetaminophen, and tamsulosin.  Allergies:       Allergies as of 10/24/2018 - Reviewed 10/24/2018  Allergen Reaction Noted  . Penicillins Hives 09/13/2013  ROS:  A 15 point review of systems was performed and pertinent positives and negatives noted in HPI  Objective:   BP 143/88  Pulse 80  Ht 171.5 cm (5' 7.5")  Wt 58.1 kg (128 lb)  BMI 19.75 kg/m  Constitutional :  alert, appears stated age, cooperative and no distress  Lymphatics/Throat:  no asymmetry, masses, or scars  Respiratory:  clear to auscultation bilaterally  Cardiovascular:  regular rate and rhythm  Gastrointestinal: soft, non-tender; bowel sounds normal; no masses, no organomegaly. inguinal hernia noted. moderate, reducible and bilateral umbilical hernia noted, reducible, non-tender  Musculoskeletal: Steady gait and movement  Skin: Cool and moist,   Psychiatric: Normal affect, non-agitated, not confused     LABS:  n/a  RADS:  n/a  Assessment:  Bilateral inguinal hernia without obstruction or gangrene, recurrence not specified [K40.20]  Initial on right, recurrent on left (hx of open repair)  Umbilical hernia, reducible  Plan:  1. Bilateral inguinal hernia without obstruction or gangrene, recurrence not specified [K40.20]  Discussed the risk of surgery including recurrence, which can be up to 50% in the case of incisional or complex hernias, possible use of prosthetic materials (mesh) and the increased risk of mesh infxn if used, bleeding, chronic pain, post-op infxn, post-op SBO or ileus, and possible re-operation to address said risks. The risks of general anesthetic, if used, includes MI, CVA, sudden death or even reaction to anesthetic medications also discussed. Alternatives include continued observation. Benefits include possible symptom relief, prevention of incarceration, strangulation, enlargement in size over time, and the risk of emergency surgery in the face of strangulation.  Typical post-op recovery time of 3-5 days with 4-6 weeks of activity restrictions were also discussed.  ED return precautions given for sudden increase in pain, size of hernia with accompanying fever, nausea, and/or vomiting.  The patient verbalized understanding and  all questions were answered to the  patient's satisfaction.  2. Patient has elected to proceed with surgical treatment. Procedure will be scheduled. Robotic assisted bilateral Written consent was obtained. Will attempt umbilical hernia repair at same time if possible.

## 2018-11-02 ENCOUNTER — Inpatient Hospital Stay: Admission: RE | Admit: 2018-11-02 | Payer: Medicare PPO | Source: Ambulatory Visit

## 2018-11-07 ENCOUNTER — Other Ambulatory Visit
Admission: RE | Admit: 2018-11-07 | Discharge: 2018-11-07 | Disposition: A | Discharge status: Home / Self Care | Payer: Medicare PPO | Source: Ambulatory Visit | Attending: Surgery | Admitting: Surgery

## 2018-11-07 ENCOUNTER — Other Ambulatory Visit: Payer: Self-pay

## 2018-11-07 DIAGNOSIS — Z01812 Encounter for preprocedural laboratory examination: Secondary | ICD-10-CM | POA: Insufficient documentation

## 2018-11-07 DIAGNOSIS — Z20828 Contact with and (suspected) exposure to other viral communicable diseases: Secondary | ICD-10-CM | POA: Insufficient documentation

## 2018-11-07 LAB — SARS CORONAVIRUS 2 (TAT 6-24 HRS): SARS Coronavirus 2: NEGATIVE

## 2018-11-07 NOTE — Pre-Procedure Instructions (Signed)
Called Mr Estock numerous times voice mail box full.  Call Dr Ines Bloomer office for a possible different phone number.  No additional phone number available.

## 2018-11-09 MED ORDER — CLINDAMYCIN PHOSPHATE 900 MG/50ML IV SOLN
900.0000 mg | INTRAVENOUS | Status: AC
  Administered 2018-11-10: 900 mg via INTRAVENOUS

## 2018-11-10 ENCOUNTER — Ambulatory Visit: Payer: Medicare PPO | Admitting: Anesthesiology

## 2018-11-10 ENCOUNTER — Encounter
Admission: RE | Disposition: A | Discharge status: Home / Self Care | Payer: Self-pay | Source: Home / Self Care | Attending: Surgery

## 2018-11-10 ENCOUNTER — Ambulatory Visit
Admission: RE | Admit: 2018-11-10 | Discharge: 2018-11-10 | Disposition: A | Discharge status: Home / Self Care | Payer: Medicare PPO | Attending: Surgery | Admitting: Surgery

## 2018-11-10 ENCOUNTER — Encounter: Payer: Self-pay | Admitting: Anesthesiology

## 2018-11-10 ENCOUNTER — Other Ambulatory Visit: Payer: Self-pay

## 2018-11-10 DIAGNOSIS — I10 Essential (primary) hypertension: Secondary | ICD-10-CM | POA: Diagnosis not present

## 2018-11-10 DIAGNOSIS — N400 Enlarged prostate without lower urinary tract symptoms: Secondary | ICD-10-CM | POA: Diagnosis not present

## 2018-11-10 DIAGNOSIS — Z8719 Personal history of other diseases of the digestive system: Secondary | ICD-10-CM | POA: Insufficient documentation

## 2018-11-10 DIAGNOSIS — Z880 Allergy status to penicillin: Secondary | ICD-10-CM | POA: Diagnosis not present

## 2018-11-10 DIAGNOSIS — K4020 Bilateral inguinal hernia, without obstruction or gangrene, not specified as recurrent: Secondary | ICD-10-CM | POA: Diagnosis present

## 2018-11-10 DIAGNOSIS — Z79899 Other long term (current) drug therapy: Secondary | ICD-10-CM | POA: Diagnosis not present

## 2018-11-10 DIAGNOSIS — F17210 Nicotine dependence, cigarettes, uncomplicated: Secondary | ICD-10-CM | POA: Insufficient documentation

## 2018-11-10 DIAGNOSIS — K4090 Unilateral inguinal hernia, without obstruction or gangrene, not specified as recurrent: Secondary | ICD-10-CM

## 2018-11-10 DIAGNOSIS — Z8673 Personal history of transient ischemic attack (TIA), and cerebral infarction without residual deficits: Secondary | ICD-10-CM | POA: Insufficient documentation

## 2018-11-10 DIAGNOSIS — E1151 Type 2 diabetes mellitus with diabetic peripheral angiopathy without gangrene: Secondary | ICD-10-CM | POA: Diagnosis not present

## 2018-11-10 DIAGNOSIS — K429 Umbilical hernia without obstruction or gangrene: Secondary | ICD-10-CM | POA: Diagnosis not present

## 2018-11-10 HISTORY — PX: INSERTION OF MESH: SHX5868

## 2018-11-10 HISTORY — PX: XI ROBOTIC ASSISTED INGUINAL HERNIA REPAIR WITH MESH: SHX6706

## 2018-11-10 LAB — POCT I-STAT 4, (NA,K, GLUC, HGB,HCT)
Glucose, Bld: 79 mg/dL (ref 70–99)
HCT: 39 % (ref 39.0–52.0)
Hemoglobin: 13.3 g/dL (ref 13.0–17.0)
Potassium: 3.7 mmol/L (ref 3.5–5.1)
Sodium: 138 mmol/L (ref 135–145)

## 2018-11-10 LAB — GLUCOSE, CAPILLARY
Glucose-Capillary: 137 mg/dL — ABNORMAL HIGH (ref 70–99)
Glucose-Capillary: 108 mg/dL — ABNORMAL HIGH (ref 70–99)

## 2018-11-10 SURGERY — REPAIR, HERNIA, INGUINAL, ROBOT-ASSISTED, LAPAROSCOPIC, USING MESH
Anesthesia: General | Site: Abdomen | Laterality: Right

## 2018-11-10 MED ORDER — FENTANYL CITRATE (PF) 100 MCG/2ML IJ SOLN
INTRAMUSCULAR | Status: DC | PRN
  Administered 2018-11-10: 100 ug via INTRAVENOUS

## 2018-11-10 MED ORDER — FENTANYL CITRATE (PF) 100 MCG/2ML IJ SOLN
INTRAMUSCULAR | Status: AC
  Filled 2018-11-10: qty 2

## 2018-11-10 MED ORDER — CHLORHEXIDINE GLUCONATE CLOTH 2 % EX PADS: 6.0000 | MEDICATED_PAD | Freq: Once | CUTANEOUS | Status: DC

## 2018-11-10 MED ORDER — PROPOFOL 10 MG/ML IV BOLUS
INTRAVENOUS | Status: DC | PRN
  Administered 2018-11-10: 100 mg via INTRAVENOUS

## 2018-11-10 MED ORDER — ROCURONIUM BROMIDE 100 MG/10ML IV SOLN
INTRAVENOUS | Status: DC | PRN
  Administered 2018-11-10: 20 mg via INTRAVENOUS
  Administered 2018-11-10: 30 mg via INTRAVENOUS
  Administered 2018-11-10: 20 mg via INTRAVENOUS

## 2018-11-10 MED ORDER — ACETAMINOPHEN 500 MG PO TABS
1000.0000 mg | ORAL_TABLET | ORAL | Status: AC
  Administered 2018-11-10: 12:00:00 1000 mg via ORAL

## 2018-11-10 MED ORDER — BUPIVACAINE-EPINEPHRINE (PF) 0.5% -1:200000 IJ SOLN
INTRAMUSCULAR | Status: AC
  Filled 2018-11-10: qty 30

## 2018-11-10 MED ORDER — LIDOCAINE HCL (PF) 2 % IJ SOLN
INTRAMUSCULAR | Status: AC
  Filled 2018-11-10: qty 10

## 2018-11-10 MED ORDER — ACETAMINOPHEN 325 MG PO TABS: 650.0000 mg | ORAL_TABLET | Freq: Three times a day (TID) | ORAL | 0 refills | Status: AC | PRN

## 2018-11-10 MED ORDER — DEXAMETHASONE SODIUM PHOSPHATE 10 MG/ML IJ SOLN
INTRAMUSCULAR | Status: AC
  Filled 2018-11-10: qty 1

## 2018-11-10 MED ORDER — ROCURONIUM BROMIDE 50 MG/5ML IV SOLN
INTRAVENOUS | Status: AC
  Filled 2018-11-10: qty 1

## 2018-11-10 MED ORDER — GLYCOPYRROLATE 0.2 MG/ML IJ SOLN
INTRAMUSCULAR | Status: DC | PRN
  Administered 2018-11-10: 0.2 mg via INTRAVENOUS

## 2018-11-10 MED ORDER — MIDAZOLAM HCL 2 MG/2ML IJ SOLN
INTRAMUSCULAR | Status: AC
  Filled 2018-11-10: qty 2

## 2018-11-10 MED ORDER — IBUPROFEN 800 MG PO TABS: 800.0000 mg | ORAL_TABLET | Freq: Three times a day (TID) | ORAL | 0 refills | Status: DC | PRN

## 2018-11-10 MED ORDER — LIDOCAINE HCL (CARDIAC) PF 100 MG/5ML IV SOSY
PREFILLED_SYRINGE | INTRAVENOUS | Status: DC | PRN
  Administered 2018-11-10: 80 mg via INTRAVENOUS

## 2018-11-10 MED ORDER — FAMOTIDINE 20 MG PO TABS
ORAL_TABLET | ORAL | Status: AC
  Administered 2018-11-10: 20 mg via ORAL
  Filled 2018-11-10: qty 1

## 2018-11-10 MED ORDER — DEXAMETHASONE SODIUM PHOSPHATE 10 MG/ML IJ SOLN
INTRAMUSCULAR | Status: DC | PRN
  Administered 2018-11-10: 5 mg via INTRAVENOUS

## 2018-11-10 MED ORDER — PROPOFOL 10 MG/ML IV BOLUS
INTRAVENOUS | Status: AC
  Filled 2018-11-10: qty 20

## 2018-11-10 MED ORDER — BUPIVACAINE LIPOSOME 1.3 % IJ SUSP
INTRAMUSCULAR | Status: AC
  Filled 2018-11-10: qty 20

## 2018-11-10 MED ORDER — ONDANSETRON HCL 4 MG/2ML IJ SOLN
INTRAMUSCULAR | Status: DC | PRN
  Administered 2018-11-10: 4 mg via INTRAVENOUS

## 2018-11-10 MED ORDER — SODIUM CHLORIDE FLUSH 0.9 % IV SOLN
INTRAVENOUS | Status: AC
  Filled 2018-11-10: qty 10

## 2018-11-10 MED ORDER — DOCUSATE SODIUM 100 MG PO CAPS: 100.0000 mg | ORAL_CAPSULE | Freq: Two times a day (BID) | ORAL | 0 refills | Status: AC | PRN

## 2018-11-10 MED ORDER — SUGAMMADEX SODIUM 500 MG/5ML IV SOLN
INTRAVENOUS | Status: DC | PRN
  Administered 2018-11-10: 130 mg via INTRAVENOUS

## 2018-11-10 MED ORDER — ONDANSETRON HCL 4 MG/2ML IJ SOLN
INTRAMUSCULAR | Status: AC
  Filled 2018-11-10: qty 2

## 2018-11-10 MED ORDER — ACETAMINOPHEN 500 MG PO TABS
ORAL_TABLET | ORAL | Status: AC
  Administered 2018-11-10: 1000 mg via ORAL
  Filled 2018-11-10: qty 2

## 2018-11-10 MED ORDER — BUPIVACAINE-EPINEPHRINE 0.5% -1:200000 IJ SOLN
INTRAMUSCULAR | Status: DC | PRN
  Administered 2018-11-10: 10 mL

## 2018-11-10 MED ORDER — DEXMEDETOMIDINE HCL 200 MCG/2ML IV SOLN
INTRAVENOUS | Status: DC | PRN
  Administered 2018-11-10 (×4): 4 ug via INTRAVENOUS

## 2018-11-10 MED ORDER — SODIUM CHLORIDE 0.9 % IV SOLN
INTRAVENOUS | Status: DC
  Administered 2018-11-10 (×2): via INTRAVENOUS

## 2018-11-10 MED ORDER — SUCCINYLCHOLINE CHLORIDE 20 MG/ML IJ SOLN
INTRAMUSCULAR | Status: DC | PRN
  Administered 2018-11-10: 100 mg via INTRAVENOUS

## 2018-11-10 MED ORDER — DEXMEDETOMIDINE HCL IN NACL 80 MCG/20ML IV SOLN
INTRAVENOUS | Status: AC
  Filled 2018-11-10: qty 20

## 2018-11-10 MED ORDER — SUCCINYLCHOLINE CHLORIDE 20 MG/ML IJ SOLN
INTRAMUSCULAR | Status: AC
  Filled 2018-11-10: qty 1

## 2018-11-10 MED ORDER — FENTANYL CITRATE (PF) 100 MCG/2ML IJ SOLN: 25.0000 ug | INTRAMUSCULAR | Status: DC | PRN

## 2018-11-10 MED ORDER — KETOROLAC TROMETHAMINE 30 MG/ML IJ SOLN
INTRAMUSCULAR | Status: AC
  Filled 2018-11-10: qty 1

## 2018-11-10 MED ORDER — SODIUM CHLORIDE 0.9 % IV SOLN
INTRAVENOUS | Status: DC | PRN
  Administered 2018-11-10: 17:00:00 via INTRAVENOUS

## 2018-11-10 MED ORDER — KETOROLAC TROMETHAMINE 30 MG/ML IJ SOLN
INTRAMUSCULAR | Status: DC | PRN
  Administered 2018-11-10: 15 mg via INTRAVENOUS

## 2018-11-10 MED ORDER — FAMOTIDINE 20 MG PO TABS
20.0000 mg | ORAL_TABLET | Freq: Once | ORAL | Status: AC
  Administered 2018-11-10: 12:00:00 20 mg via ORAL

## 2018-11-10 MED ORDER — HYDROCODONE-ACETAMINOPHEN 5-325 MG PO TABS: 1.0000 | ORAL_TABLET | Freq: Four times a day (QID) | ORAL | 0 refills | Status: AC | PRN

## 2018-11-10 MED ORDER — CLINDAMYCIN PHOSPHATE 900 MG/50ML IV SOLN
INTRAVENOUS | Status: AC
  Filled 2018-11-10: qty 50

## 2018-11-10 MED ORDER — ONDANSETRON HCL 4 MG/2ML IJ SOLN: 4.0000 mg | Freq: Once | INTRAMUSCULAR | Status: DC | PRN

## 2018-11-10 MED ORDER — BUPIVACAINE HCL (PF) 0.5 % IJ SOLN
INTRAMUSCULAR | Status: AC
  Filled 2018-11-10: qty 30

## 2018-11-10 SURGICAL SUPPLY — 64 items
"PENCIL ELECTRO HAND CTR " (MISCELLANEOUS) IMPLANT
ADH SKN CLS APL DERMABOND .7 (GAUZE/BANDAGES/DRESSINGS) ×1
APL PRP STRL LF DISP 70% ISPRP (MISCELLANEOUS) ×1
BAG INFUSER PRESSURE 100CC (MISCELLANEOUS) IMPLANT
BLADE SURG SZ11 CARB STEEL (BLADE) ×3 IMPLANT
BNDG GAUZE 4.5X4.1 6PLY STRL (MISCELLANEOUS) ×3 IMPLANT
CANISTER SUCT 1200ML W/VALVE (MISCELLANEOUS) ×3 IMPLANT
CANNULA REDUC XI 12-8 STAPL (CANNULA) ×1
CANNULA REDUC XI 12-8MM STAPL (CANNULA) ×1
CANNULA REDUCER 12-8 DVNC XI (CANNULA) ×1 IMPLANT
CHLORAPREP W/TINT 26 (MISCELLANEOUS) ×3 IMPLANT
COVER TIP SHEARS 8 DVNC (MISCELLANEOUS) ×1 IMPLANT
COVER TIP SHEARS 8MM DA VINCI (MISCELLANEOUS) ×2
COVER WAND RF STERILE (DRAPES) ×3 IMPLANT
DEFOGGER SCOPE WARMER CLEARIFY (MISCELLANEOUS) ×3 IMPLANT
DERMABOND ADVANCED (GAUZE/BANDAGES/DRESSINGS) ×2
DERMABOND ADVANCED .7 DNX12 (GAUZE/BANDAGES/DRESSINGS) ×1 IMPLANT
DRAPE 3/4 80X56 (DRAPES) ×3 IMPLANT
DRAPE ARM DVNC X/XI (DISPOSABLE) ×4 IMPLANT
DRAPE COLUMN DVNC XI (DISPOSABLE) ×1 IMPLANT
DRAPE DA VINCI XI ARM (DISPOSABLE) ×8
DRAPE DA VINCI XI COLUMN (DISPOSABLE) ×2
ELECT REM PT RETURN 9FT ADLT (ELECTROSURGICAL) ×3
ELECTRODE REM PT RTRN 9FT ADLT (ELECTROSURGICAL) ×1 IMPLANT
ETHIBOND 2 0 GREEN CT 2 30IN (SUTURE) IMPLANT
FORCEPS PROGRASP DVNC XI (FORCEP) ×1 IMPLANT
FORCEPS XI PROGRASP DA VINCI (FORCEP)
GLOVE BIOGEL PI IND STRL 7.0 (GLOVE) ×2 IMPLANT
GLOVE BIOGEL PI INDICATOR 7.0 (GLOVE) ×8
GLOVE SURG SYN 6.5 ES PF (GLOVE) ×12 IMPLANT
GLOVE SURG SYN 6.5 PF PI (GLOVE) ×2 IMPLANT
GOWN STRL REUS W/ TWL LRG LVL3 (GOWN DISPOSABLE) ×3 IMPLANT
GOWN STRL REUS W/TWL LRG LVL3 (GOWN DISPOSABLE) ×9
GRASPER SUT TROCAR 14GX15 (MISCELLANEOUS) ×3 IMPLANT
IRRIGATOR SUCT 8 DISP DVNC XI (IRRIGATION / IRRIGATOR) IMPLANT
IRRIGATOR SUCTION 8MM XI DISP (IRRIGATION / IRRIGATOR)
IV NS 1000ML (IV SOLUTION)
IV NS 1000ML BAXH (IV SOLUTION) IMPLANT
KIT PINK PAD W/HEAD ARE REST (MISCELLANEOUS) ×3
KIT PINK PAD W/HEAD ARM REST (MISCELLANEOUS) ×1 IMPLANT
LABEL OR SOLS (LABEL) ×3 IMPLANT
MESH 3DMAX 4X6 RT LRG (Mesh General) ×2 IMPLANT
MESH 3DMAX MID 4X6 RT LRG (Mesh General) IMPLANT
NEEDLE HYPO 22GX1.5 SAFETY (NEEDLE) ×3 IMPLANT
NEEDLE VERESS 14GA 120MM (NEEDLE) ×3 IMPLANT
OBTURATOR OPTICAL STANDARD 8MM (TROCAR) ×2
OBTURATOR OPTICAL STND 8 DVNC (TROCAR) ×1
OBTURATOR OPTICALSTD 8 DVNC (TROCAR) ×1 IMPLANT
PACK LAP CHOLECYSTECTOMY (MISCELLANEOUS) ×3 IMPLANT
PENCIL ELECTRO HAND CTR (MISCELLANEOUS) ×2 IMPLANT
SEAL CANN UNIV 5-8 DVNC XI (MISCELLANEOUS) ×2 IMPLANT
SEAL XI 5MM-8MM UNIVERSAL (MISCELLANEOUS) ×4
SOLUTION ELECTROLUBE (MISCELLANEOUS) ×3 IMPLANT
STAPLER CANNULA SEAL DVNC XI (STAPLE) ×1 IMPLANT
STAPLER CANNULA SEAL XI (STAPLE) ×2
SUT DVC VLOC 3-0 CL 6 P-12 (SUTURE) ×6 IMPLANT
SUT MNCRL AB 4-0 PS2 18 (SUTURE) ×5 IMPLANT
SUT VIC AB 3-0 SH 27 (SUTURE) ×3
SUT VIC AB 3-0 SH 27X BRD (SUTURE) ×1 IMPLANT
SUT VICRYL 0 AB UR-6 (SUTURE) ×3 IMPLANT
SYR 30ML LL (SYRINGE) ×3 IMPLANT
TRAY FOLEY MTR SLVR 16FR STAT (SET/KITS/TRAYS/PACK) ×3 IMPLANT
TROCAR XCEL NON-BLD 5MMX100MML (ENDOMECHANICALS) ×3 IMPLANT
TUBING EVAC SMOKE HEATED PNEUM (TUBING) ×3 IMPLANT

## 2018-11-10 NOTE — Interval H&P Note (Signed)
History and Physical Interval Note:  11/10/2018 2:34 PM  Peter Howard.  has presented today for surgery, with the diagnosis of K40.20 BILATERAL INGUINAL HERNIA.  The various methods of treatment have been discussed with the patient and family. After consideration of risks, benefits and other options for treatment, the patient has consented to  Procedure(s): XI ROBOTIC ASSISTED INGUINAL HERNIA REPAIR, BILATERAL, DIABETIC (Bilateral) as a surgical intervention.  The patient's history has been reviewed, patient examined, no change in status, stable for surgery.  I have reviewed the patient's chart and labs.  Questions were answered to the patient's satisfaction.     Sharilynn Cassity Lysle Pearl

## 2018-11-10 NOTE — Anesthesia Postprocedure Evaluation (Signed)
Anesthesia Post Note  Patient: Peter Howard.  Procedure(s) Performed: XI ROBOTIC ASSISTED RIGHT  INGUINAL HERNIA REPAIR (Right Abdomen) INSERTION OF MESH (Right Abdomen)  Patient location during evaluation: PACU Anesthesia Type: General Level of consciousness: awake and alert Pain management: pain level controlled Vital Signs Assessment: post-procedure vital signs reviewed and stable Respiratory status: spontaneous breathing, nonlabored ventilation and respiratory function stable Cardiovascular status: blood pressure returned to baseline and stable Postop Assessment: no apparent nausea or vomiting Comments: I spoke with the patients wife and explained that after intubation one of the patients teeth was found to be very loose and was easily removed for airway protection.  She reported that all of his front teeth are very loose and voiced understanding.      Last Vitals:  Vitals:   11/10/18 1900 11/10/18 1927  BP: (!) 139/94 (!) 136/95  Pulse: 72 69  Resp: 17 20  Temp: 36.8 C 36.7 C  SpO2: 97% 98%    Last Pain:  Vitals:   11/10/18 1927  TempSrc:   PainSc: 0-No pain                 Precious Haws Piscitello

## 2018-11-10 NOTE — Anesthesia Procedure Notes (Signed)
Procedure Name: Intubation Date/Time: 11/10/2018 2:51 PM Performed by: Dionne Bucy, CRNA Pre-anesthesia Checklist: Patient identified, Patient being monitored, Timeout performed, Emergency Drugs available and Suction available Patient Re-evaluated:Patient Re-evaluated prior to induction Oxygen Delivery Method: Circle system utilized Preoxygenation: Pre-oxygenation with 100% oxygen Induction Type: IV induction Ventilation: Mask ventilation without difficulty Laryngoscope Size: Mac and 4 Grade View: Grade II Tube type: Oral Tube size: 7.5 mm Number of attempts: 1 Airway Equipment and Method: Stylet Placement Confirmation: ETT inserted through vocal cords under direct vision,  positive ETCO2 and breath sounds checked- equal and bilateral Secured at: 22 cm Tube secured with: Tape Dental Injury: Teeth and Oropharynx as per pre-operative assessment  Comments: Pt intubated per T. Kris Mouton, CRNA

## 2018-11-10 NOTE — Discharge Instructions (Signed)

## 2018-11-10 NOTE — Transfer of Care (Addendum)
Immediate Anesthesia Transfer of Care Note  Patient: Peter Howard.  Procedure(s) Performed: XI ROBOTIC ASSISTED RIGHT  INGUINAL HERNIA REPAIR (Right Abdomen) INSERTION OF MESH (Right Abdomen)  Patient Location: PACU  Anesthesia Type:General  Level of Consciousness: sedated  Airway & Oxygen Therapy: Patient Spontanous Breathing and Patient connected to face mask oxygen  Post-op Assessment: Report given to RN and Post -op Vital signs reviewed and stable  Post vital signs: Reviewed and stable  Last Vitals:  Vitals Value Taken Time  BP 102/89 11/10/18 1812  Temp    Pulse 79 11/10/18 1815  Resp 20 11/10/18 1815  SpO2 100 % 11/10/18 1815  Vitals shown include unvalidated device data.  Last Pain:  Vitals:   11/10/18 1126  TempSrc: Temporal  PainSc: 0-No pain         Complications: No apparent anesthesia complications

## 2018-11-10 NOTE — Anesthesia Preprocedure Evaluation (Addendum)
Anesthesia Evaluation  Patient identified by MRN, date of birth, ID band Patient awake    Reviewed: Allergy & Precautions, H&P , NPO status , Patient's Chart, lab work & pertinent test results, reviewed documented beta blocker date and time   Airway Mallampati: II  TM Distance: >3 FB Neck ROM: full    Dental  (+) Partial Lower, Missing, Poor Dentition   Pulmonary Current Smoker,    Pulmonary exam normal breath sounds clear to auscultation       Cardiovascular Exercise Tolerance: Poor hypertension, + Peripheral Vascular Disease  negative cardio ROS   Rhythm:regular Rate:Normal     Neuro/Psych Mild speech dysfunction following cva TIACVA, Residual Symptoms negative neurological ROS  negative psych ROS   GI/Hepatic Neg liver ROS,   Endo/Other  negative endocrine ROSdiabetes, Well Controlled  Renal/GU negative Renal ROS  negative genitourinary   Musculoskeletal negative musculoskeletal ROS (+)   Abdominal   Peds negative pediatric ROS (+)  Hematology negative hematology ROS (+)   Anesthesia Other Findings   Reproductive/Obstetrics negative OB ROS                            Anesthesia Physical  Anesthesia Plan  ASA: III  Anesthesia Plan: General   Post-op Pain Management:    Induction: Intravenous  PONV Risk Score and Plan:   Airway Management Planned: Oral ETT  Additional Equipment:   Intra-op Plan:   Post-operative Plan: Extubation in OR  Informed Consent: I have reviewed the patients History and Physical, chart, labs and discussed the procedure including the risks, benefits and alternatives for the proposed anesthesia with the patient or authorized representative who has indicated his/her understanding and acceptance.     Dental Advisory Given  Plan Discussed with: CRNA  Anesthesia Plan Comments:         Anesthesia Quick Evaluation

## 2018-11-10 NOTE — Anesthesia Post-op Follow-up Note (Signed)
Anesthesia QCDR form completed.        

## 2018-11-12 ENCOUNTER — Encounter: Payer: Self-pay | Admitting: Surgery

## 2018-11-13 NOTE — Op Note (Signed)
Preoperative diagnosis: Bilateral inguinal Hernia.  Postoperative diagnosis: Right indirect inguinal Hernia  Procedure: Robotic assisted laparoscopic right indirect inguinal hernia repair with mesh  Anesthesia: General  Surgeon: Dr. Lysle Pearl  Wound Classification: Clean  Specimen: none  Complications: None  Estimated Blood Loss: 43mL   Indications: Bilateral inguinal hernia. Repair was indicated to avoid complications of incarceration, obstruction and pain, and a prosthetic mesh repair was elected.  See H&P for further details.  Findings: 1. Vas Deferens and cord structures identified and preserved 2. Bard 3D max medium mesh used for repair 3. Adequate hemostasis achieved 4.  Left side noted to have no evidence of inguinal hernia with intact repair from previous surgery likely a plug and patch.   Description of procedure: The patient was taken to the operating room. A time-out was completed verifying correct patient, procedure, site, positioning, and implant(s) and/or special equipment prior to beginning this procedure.  Bilateral groin was prepped and draped in the usual sterile fashion. An incision was marked 20 cm above the pubic tubercle, slightly above the umbilicus  Scrotum wrapped in Kerlix roll.  Foley catheter placed.  Incision was made at the previously marked site after injecting local anesthesia.  Dissection carried down to the fascia where at this point a Veress needle was inserted.  Saline drop test noted to be positive with gradual increase in pressure after initiation of gas insufflation.  15 mm of pressure was achieved prior to removing the Veress needle and then placing a 5 mm port via the Optiview technique through the previous needle insertion site.  Inspection of the area afterwards noted no injury to the surrounding organs during insertion of the needle and the Optiview port.  2 port sites were marked 8 cm to the lateral sides of the initial port, and a 8 mm robotic  port was placed on the left side, 12 mm robotic port on the right side under direct supervision.  Local anesthesia  infused to the preplanned incision site prior to insertion of the port, and exparel infused as an ilioinguinal block under direct visualization. The Gordon was then brought into the operative field and docked to the ports.  Examination of the abdominal cavity noted a right inguinal hernia with what seemed like the ureter extending into it.  A peritoneal flap was created approximately 8cm cephalad to the defect by using scissors with electrocautery.  Dissection was carried down towards the pubic tubercle, developing the myopectineal orifice view.  Laterally the flap was carried towards the ASIS.  Indirect hernia sac was noted, with the ureter and vas deferens running alongside it into the defect.  The sac was carefully dissected away from the adjacent tissues to be fully reduced out of hernia cavity.  Any bleeding was controlled with combination of electrocautery and manual pressure.    After confirming adequate dissection and the peritoneal reflection completely down and away from the cord structures, a Bard 3DMax large medium weight mesh was placed within the anterior abdominal wall, secured in place using 2-0 Vicryl on an SH needle immediately above the pubic tubercle.  After noting proper placement of the mesh with the peritoneal reflection deep to it, the previously created peritoneal flap was secured back up to the anterior abdominal wall using running 3-0 V-Lock on a noncutting needle.  Examination of the left side did not note any inguinal hernias but a evidence of previous repair like with a plug and patch.  Both needles were then removed out of  the abdominal cavity, Xi platform undocked from the ports and removed off of operative field.  The 12 mm port site closed with PMI device using 0 Vicryl, using the robotic camera.  Abdomen then desufflated and remaining ports  removed.  The deep dermal layer at 12 mm port site closed with interrupted 3-0 Vicryl.  All the skin incisions were then closed with a subcuticular stitch of Monocryl 4-0. Dermabond was applied. The testis was gently pulled down into its anatomic position in the scrotum.  Foley catheter removed. The patient tolerated the procedure well and was taken to the postanesthesia care unit in stable condition. Sponge and instrument count correct at end of procedure.

## 2019-02-23 ENCOUNTER — Emergency Department
Admission: EM | Admit: 2019-02-23 | Discharge: 2019-02-24 | Disposition: A | Discharge status: Home / Self Care | Payer: Medicare PPO | Attending: Emergency Medicine | Admitting: Emergency Medicine

## 2019-02-23 ENCOUNTER — Encounter: Payer: Self-pay | Admitting: Emergency Medicine

## 2019-02-23 ENCOUNTER — Emergency Department: Payer: Medicare PPO

## 2019-02-23 ENCOUNTER — Other Ambulatory Visit: Payer: Self-pay

## 2019-02-23 DIAGNOSIS — Z8673 Personal history of transient ischemic attack (TIA), and cerebral infarction without residual deficits: Secondary | ICD-10-CM | POA: Insufficient documentation

## 2019-02-23 DIAGNOSIS — Y908 Blood alcohol level of 240 mg/100 ml or more: Secondary | ICD-10-CM | POA: Insufficient documentation

## 2019-02-23 DIAGNOSIS — F17210 Nicotine dependence, cigarettes, uncomplicated: Secondary | ICD-10-CM | POA: Diagnosis not present

## 2019-02-23 DIAGNOSIS — E119 Type 2 diabetes mellitus without complications: Secondary | ICD-10-CM | POA: Diagnosis not present

## 2019-02-23 DIAGNOSIS — I10 Essential (primary) hypertension: Secondary | ICD-10-CM | POA: Diagnosis not present

## 2019-02-23 DIAGNOSIS — F1010 Alcohol abuse, uncomplicated: Secondary | ICD-10-CM | POA: Diagnosis not present

## 2019-02-23 DIAGNOSIS — R569 Unspecified convulsions: Secondary | ICD-10-CM | POA: Diagnosis not present

## 2019-02-23 DIAGNOSIS — R4182 Altered mental status, unspecified: Secondary | ICD-10-CM | POA: Diagnosis present

## 2019-02-23 LAB — TROPONIN I (HIGH SENSITIVITY): Troponin I (High Sensitivity): 25 ng/L — ABNORMAL HIGH (ref <18)

## 2019-02-23 LAB — CBC WITH DIFFERENTIAL/PLATELET
Abs Immature Granulocytes: 0.01 10*3/uL (ref 0.00–0.07)
Basophils Absolute: 0 10*3/uL (ref 0.0–0.1)
Basophils Relative: 1 %
Eosinophils Absolute: 0.3 10*3/uL (ref 0.0–0.5)
Eosinophils Relative: 5 %
HCT: 44 % (ref 39.0–52.0)
Hemoglobin: 14.6 g/dL (ref 13.0–17.0)
Immature Granulocytes: 0 %
Lymphocytes Relative: 41 %
Lymphs Abs: 2.6 10*3/uL (ref 0.7–4.0)
MCH: 32 pg (ref 26.0–34.0)
MCHC: 33.2 g/dL (ref 30.0–36.0)
MCV: 96.5 fL (ref 80.0–100.0)
Monocytes Absolute: 0.7 10*3/uL (ref 0.1–1.0)
Monocytes Relative: 10 %
Neutro Abs: 2.7 10*3/uL (ref 1.7–7.7)
Neutrophils Relative %: 43 %
Platelets: 156 10*3/uL (ref 150–400)
RBC: 4.56 MIL/uL (ref 4.22–5.81)
RDW: 14.9 % (ref 11.5–15.5)
WBC: 6.3 10*3/uL (ref 4.0–10.5)
nRBC: 0 % (ref 0.0–0.2)

## 2019-02-23 LAB — COMPREHENSIVE METABOLIC PANEL
ALT: 17 U/L (ref 0–44)
AST: 28 U/L (ref 15–41)
Albumin: 4.5 g/dL (ref 3.5–5.0)
Alkaline Phosphatase: 61 U/L (ref 38–126)
Anion gap: 13 (ref 5–15)
BUN: 11 mg/dL (ref 8–23)
CO2: 26 mmol/L (ref 22–32)
Calcium: 9.5 mg/dL (ref 8.9–10.3)
Chloride: 102 mmol/L (ref 98–111)
Creatinine, Ser: 0.73 mg/dL (ref 0.61–1.24)
GFR calc Af Amer: >60 mL/min (ref >60)
GFR calc non Af Amer: >60 mL/min (ref >60)
Glucose, Bld: 116 mg/dL — ABNORMAL HIGH (ref 70–99)
Potassium: 3.3 mmol/L — ABNORMAL LOW (ref 3.5–5.1)
Sodium: 141 mmol/L (ref 135–145)
Total Bilirubin: 0.5 mg/dL (ref 0.3–1.2)
Total Protein: 8.4 g/dL — ABNORMAL HIGH (ref 6.5–8.1)

## 2019-02-23 LAB — LACTIC ACID, PLASMA: Lactic Acid, Venous: 1.8 mmol/L (ref 0.5–1.9)

## 2019-02-23 LAB — CK: Total CK: 80 U/L (ref 49–397)

## 2019-02-23 LAB — ETHANOL: Alcohol, Ethyl (B): 248 mg/dL — ABNORMAL HIGH (ref <10)

## 2019-02-23 NOTE — ED Notes (Signed)
Vital signs validated at 2150 include inaccurate pulse of 154.Pt moving in the bed and pulling of monitor cords and blood pressure cuff.

## 2019-02-23 NOTE — ED Provider Notes (Signed)
----------------------------------------- 11:07 PM on 02/23/2019 -----------------------------------------  Assuming care from Dr. Jari Pigg.  In short, Peter Howard. is a 73 y.o. male with a chief complaint of intoxication and possible seizure.  Refer to the original H&P for additional details.  The current plan of care is to reassess when sober.    ----------------------------------------- 12:07 AM on 02/24/2019 -----------------------------------------  ED ECG REPORT I, Hinda Kehr, the attending physician, personally viewed and interpreted this ECG.  Date: 02/23/2019 EKG Time: 23: 49 Rate: 58 Rhythm: normal sinus rhythm QRS Axis: normal Intervals: Borderline LVH ST/T Wave abnormalities: Non-specific ST segment / T-wave changes, but no clear evidence of acute ischemia. Narrative Interpretation: no definitive evidence of acute ischemia; does not meet STEMI criteria.  No evidence of complete heart block as was suggested on a prior EKG.  There is some underlying motion artifact but no suggestion of ischemia.    ----------------------------------------- 12:24 AM on 02/24/2019 -----------------------------------------  Repeat high-sensitivity troponin is 30 which is a delta of 5.  However the patient did not have chest pain and overall this is a reassuring result.  We will continue to monitor for sobriety and tested ambulation prior to discharge home with outpatient follow-up.    ----------------------------------------- 1:29 AM on 02/24/2019 -----------------------------------------  As I was walking into an adjacent room to see a new patient, this patient's wife exited his exam room followed by the patient who was stumbling after her.  I asked what was going on and before anyone could respond the patient collapsed to the floor, rolling onto his back and striking the back of his head on the floor.  I witnessed it but could not get to him before he hit.  He did not lose  consciousness.  I assisted him to his feet and we assisted him back to the bed.  He is exceedingly unsteady on his feet and could not ambulate more than a few steps without falling.  His wife says that he wants to go home so she was taking him home.  She also made the comment that he "does not normally walk like that" but then also made the comment that "he falls at home".  Her behavior over the fall is odd.  We then pointed out that she was in the room earlier tonight when the patient fell and had soiled himself on the floor, and she claims that she did not remember being in the room and seemed genuinely puzzled at this.  I ordered a repeat head CT given his acute injury.  I will need to reassess to determine the appropriate disposition but at this point he does not seem safe to go home particularly given the apparent disconnect between his symptoms and his wife's understanding of them and degree of concern over his current difficulties.   ----------------------------------------- 6:01 AM on 02/24/2019 -----------------------------------------  Given the patient's persistent symptoms and his history of aphasia, as well as his persistent difficulty with ambulation and his fall, I ordered an MR brain to make sure there is no evidence of acute CVA.  This necessitated the use of chest and abdominal radiographs to make sure there were no metallic foreign objects present as per MRI protocol.  The MRI was obtained and the patient has no evidence of acute CVA or other abnormality on the MR brain.  He will need reassessment for stability of ambulation prior to discharge.  He is currently resting quietly and comfortably.  I have some concerns given that his wife was present in  the room with him while he was on the floor in the ED in his own feces before I came on my shift, and that she tried to leave with him when he was still unsteady causing him to fall and hit his head as I documented above, but at this point  I do not have a clear indication for admission.   ----------------------------------------- 6:52 AM on 02/24/2019 -----------------------------------------  Patient still resting. Transferring ED care to Dr. Archie Balboa to coordinate tests of assisted ambulation to determine appropriate disposition once clinically sober.   Hinda Kehr, MD 02/24/19 [phone]

## 2019-02-23 NOTE — ED Provider Notes (Signed)
[facility] Emergency Department Provider Note  ____________________________________________   First MD Initiated Contact with Patient 02/23/19 2049     (approximate)  I have reviewed the triage vital signs and the nursing notes.   HISTORY  Chief Complaint AMS   HPI Peter Howard. is a 73 y.o. male with diabetes, hypertension, hyperlipidemia, stroke with baseline aphasia who comes in with concern for possible seizure.  Per EMS wife states that patient had a prior seizure history 30 to 40 years ago but is not on any medications.  Patient had 1 beer and while driving home had some swerving and they noticed after they got home that he had urinated on himself and was more confused but got better after a few minutes.  He is currently at baseline according to the wife at baseline difficult to understand due to his aphasia.  He really denies any pain at this time or any other symptoms.  This episode occurred 1 time, nothing made it better, nothing made it worse.           Past Medical History:  Diagnosis Date  . BPH (benign prostatic hypertrophy)   . Diabetes mellitus without complication ([place])   . Glaucoma   . Hyperlipidemia   . Hypertension   . Peripheral vascular disease ([place])   . Stroke ([place])   . TIA (transient ischemic attack)   . Tubular adenoma of colon     There are no problems to display for this patient.   Past Surgical History:  Procedure Laterality Date  . COLONOSCOPY    . COLONOSCOPY N/A 03/13/2015   Procedure: COLONOSCOPY;  Surgeon: Hulen Luster, MD;  Location: [facility] ENDOSCOPY;  Service: Gastroenterology;  Laterality: N/A;  DIABETIC  . EYE SURGERY     glaucoma  . FOOT SURGERY    . HERNIA REPAIR    . INSERTION OF MESH Right 11/10/2018   Procedure: INSERTION OF MESH;  Surgeon: Benjamine Sprague, DO;  Location: [facility] ORS;  Service: General;  Laterality: Right;  . ROTATOR CUFF REPAIR    . XI ROBOTIC ASSISTED INGUINAL HERNIA REPAIR WITH MESH  Right 11/10/2018   Procedure: XI ROBOTIC ASSISTED RIGHT  INGUINAL HERNIA REPAIR;  Surgeon: Benjamine Sprague, DO;  Location: [facility] ORS;  Service: General;  Laterality: Right;    Prior to Admission medications   Medication Sig Start Date End Date Taking? Authorizing Provider  brimonidine (ALPHAGAN) 0.2 % ophthalmic solution Place 1 drop into both eyes 3 (three) times daily.    [provider]  Cholecalciferol (VITAMIN D) 50 MCG (2000 UT) CAPS Take 2,000 Units by mouth daily.    [provider]  donepezil (ARICEPT) 10 MG tablet Take 10 mg by mouth at bedtime.    [provider]  dorzolamide-timolol (COSOPT) 22.3-6.8 MG/ML ophthalmic solution Place 1 drop into both eyes 2 (two) times daily.     [provider]  ibuprofen (ADVIL) 800 MG tablet Take 1 tablet (800 mg total) by mouth every 8 (eight) hours as needed for mild pain or moderate pain. 11/10/18   Sakai, Isami, DO  ipratropium (ATROVENT) 0.06 % nasal spray Place 2 sprays into both nostrils daily as needed for rhinitis.    [provider]  mirtazapine (REMERON) 15 MG tablet Take 15 mg by mouth at bedtime.    [provider]  omeprazole (PRILOSEC) 40 MG capsule Take 40 mg by mouth daily.     [provider]  tamsulosin (FLOMAX) 0.4 MG CAPS capsule  Take 0.4 mg by mouth daily.     [provider]  vitamin B-12 (CYANOCOBALAMIN) 1000 MCG tablet Take 1,000 mcg by mouth daily.    [provider]    Allergies Penicillins  No family history on file.  Social History Social History   Tobacco Use  . Smoking status: Current Some Day Smoker    Types: Cigarettes  . Smokeless tobacco: Never Used  Substance Use Topics  . Alcohol use: Yes  . Drug use: No      Review of Systems Constitutional: No fever/chills Eyes: No visual changes. ENT: No sore throat. Cardiovascular: Denies chest pain. Respiratory: Denies shortness of breath. Gastrointestinal: No abdominal pain.  No  nausea, no vomiting.  No diarrhea.  No constipation. Genitourinary: Negative for dysuria.  Urinary incontinence Musculoskeletal: Negative for back pain. Skin: Negative for rash. Neurological: Negative for headaches, focal weakness or numbness.  Slurred speech at baseline  All other ROS negative ____________________________________________   PHYSICAL EXAM:  VITAL SIGNS: Blood pressure (!) 134/96, pulse 65, temperature 97.6 F (36.4 C), temperature source Oral, resp. rate (!) 21, height 5\' 8"  (1.727 m), weight 65 kg, SpO2 100 %.  Constitutional: Alert and oriented. Well appearing and in no acute distress appears potentially under the influence . Eyes: Conjunctivae are normal. EOMI. Head: Atraumatic. Nose: No congestion/rhinnorhea. Mouth/Throat: Mucous membranes are moist.   Neck: No stridor. Trachea Midline. FROM Cardiovascular: Normal rate, regular rhythm. Grossly normal heart sounds.  Good peripheral circulation. Respiratory: Normal respiratory effort.  No retractions. Lungs CTAB. Gastrointestinal: Soft and nontender. No distention. No abdominal bruits.  Musculoskeletal: No lower extremity tenderness nor edema.  No joint effusions. Neurologic:  Difficult to understand due to aphasia, CN2-12 intact, MAEW.  Skin:  Skin is warm, dry and intact. No rash noted. Psychiatric: Mood and affect are elevated, possible under influence  GU: Deferred   ____________________________________________   LABS (all labs ordered are listed, but only abnormal results are displayed)  Labs Reviewed  COMPREHENSIVE METABOLIC PANEL - Abnormal; Notable for the following components:      Result Value   Potassium 3.3 (*)    Glucose, Bld 116 (*)    Total Protein 8.4 (*)    All other components within normal limits  ETHANOL - Abnormal; Notable for the following components:   Alcohol, Ethyl (B) 248 (*)    All other components within normal limits  TROPONIN I (HIGH SENSITIVITY) - Abnormal; Notable for the  following components:   Troponin I (High Sensitivity) 25 (*)    All other components within normal limits  CBC WITH DIFFERENTIAL/PLATELET  CK  LACTIC ACID, PLASMA  LACTIC ACID, PLASMA  URINALYSIS, ROUTINE W REFLEX MICROSCOPIC  URINE DRUG SCREEN, QUALITATIVE ([facility] ONLY)  TROPONIN I (HIGH SENSITIVITY)   ____________________________________________   ED ECG REPORT I, Vanessa [place], the attending physician, personally viewed and interpreted this ECG.  EKG rate of 56, no ST elevation, no T wave inversions, normal intervals.  Really complete heart block but I think is due to the artifact from his shaking. ____________________________________________  RADIOLOGY   Official radiology report(s): CT Head Wo Contrast  Result Date: 02/23/2019 CLINICAL DATA:  Altered mental status EXAM: CT HEAD WITHOUT CONTRAST TECHNIQUE: Contiguous axial images were obtained from the base of the skull through the vertex without intravenous contrast. COMPARISON:  CT 04/23/2018 FINDINGS: Brain: No evidence of acute infarction, hemorrhage, hydrocephalus, extra-axial collection or mass lesion/mass effect. Symmetric prominence of the ventricles, cisterns and sulci compatible with parenchymal volume loss.  Patchy areas of white matter hypoattenuation are most compatible with chronic microvascular angiopathy. Senescent mineralization of the basal ganglia. Vascular: Atherosclerotic calcification of the carotid siphons. No hyperdense vessel. Skull: No calvarial fracture or suspicious osseous lesion. No scalp swelling or hematoma. Sinuses/Orbits: Persistent opacification of the left ethmoids and maxillary sinus with sclerotic mural changes suggesting chronicity. No abnormal stranding adjacent the imaged sinus perimeter. Included orbital structures are unremarkable. Other: None IMPRESSION: 1. No acute intracranial abnormality. 2. Stable parenchymal volume loss and chronic microvascular angiopathy. 3. 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 Electronically Signed   By: Lovena Le M.D.   On: 02/23/2019 21:27    ____________________________________________   PROCEDURES  Procedure(s) performed (including Critical Care):  Procedures   ____________________________________________   INITIAL IMPRESSION / ASSESSMENT AND PLAN / ED COURSE  Peter Carle. was evaluated in Emergency Department on 02/23/2019 for the symptoms described in the history of present illness. He was evaluated in the context of the global COVID-19 pandemic, which necessitated consideration that the patient might be at risk for infection with the SARS-CoV-2 virus that causes COVID-19. Institutional protocols and algorithms that pertain to the evaluation of patients at risk for COVID-19 are in a state of rapid change based on information released by regulatory bodies including the CDC and federal and state organizations. These policies and algorithms were followed during the patient's care in the ED.    Patient comes in with concern for possible seizure after being at the bar and supposedly having 1 drink but then swerving in the street and having some urinary incontinence and not acting his normal self.  Difficult to understand patient due to his slurred speech which according to family is at baseline.  Does not appear to have any cranial nerve deficits to suggest a stroke.  Will get CT head to evaluate for intracranial hemorrhage, tumor.  Will get labs to evaluate for electrolyte abnormalities, AKI.  Less likely this was an arrhythmia or ACS.  Will get ethanol level given report of him drinking and on exam he seems like he may be intoxicated..  CT head negative for acute changes.  Labs are reassuring otherwise but EtOH significantly elevated which is most likely the cause of his symptoms.  CK and lactate are negative therefore I have low suspicion for seizures.  Wife was in the room and patient climbed out of bed and was sitting on the  ground with stool and urine around him.  Wife did not alert [place] of this.  When he came into the room we helped get patient back into the bed.  Wife witnessed the event and he did not hit his head.  Patient still seems intoxicated.  Patient handed off to oncoming team pending repeat EKG, troponin and ambulation trial once he has detoxed some.  I have low suspicion for injury but will need to make sure that he can ambulate given he was found sitting on the ground.   ____________________________________________   FINAL CLINICAL IMPRESSION(S) / ED DIAGNOSES   Final diagnoses:  None      MEDICATIONS GIVEN DURING THIS VISIT:  Medications - No data to display   ED Discharge Orders    None       Note:  This document was prepared using Dragon voice recognition software and may include unintentional dictation errors.   Vanessa [place], MD 02/23/19 [phone]

## 2019-02-23 NOTE — ED Triage Notes (Signed)
EMS pt to rm 7 from home with report of pt went out with wife to celebrate his birthday. Had "1 drink". Driving home started swevering. Got home and pt was unresponsive to wife. Had been incont. Of urine. On EMS arrival pt was A and O at his base line per the wife. Remote hx of seizures.

## 2019-02-24 ENCOUNTER — Emergency Department: Payer: Medicare PPO

## 2019-02-24 LAB — URINALYSIS, ROUTINE W REFLEX MICROSCOPIC
Bilirubin Urine: NEGATIVE
Glucose, UA: NEGATIVE mg/dL
Hgb urine dipstick: NEGATIVE
Ketones, ur: NEGATIVE mg/dL
Leukocytes,Ua: NEGATIVE
Nitrite: NEGATIVE
Protein, ur: NEGATIVE mg/dL
Specific Gravity, Urine: 1.002 — ABNORMAL LOW (ref 1.005–1.030)
pH: 6 (ref 5.0–8.0)

## 2019-02-24 LAB — URINE DRUG SCREEN, QUALITATIVE (ARMC ONLY)
Amphetamines, Ur Screen: NOT DETECTED
Barbiturates, Ur Screen: NOT DETECTED
Benzodiazepine, Ur Scrn: NOT DETECTED
Cannabinoid 50 Ng, Ur ~~LOC~~: NOT DETECTED
Cocaine Metabolite,Ur ~~LOC~~: NOT DETECTED
MDMA (Ecstasy)Ur Screen: NOT DETECTED
Methadone Scn, Ur: NOT DETECTED
Opiate, Ur Screen: NOT DETECTED
Phencyclidine (PCP) Ur S: NOT DETECTED
Tricyclic, Ur Screen: NOT DETECTED

## 2019-02-24 LAB — TROPONIN I (HIGH SENSITIVITY): Troponin I (High Sensitivity): 30 ng/L — ABNORMAL HIGH (ref <18)

## 2019-02-24 MED ORDER — SODIUM CHLORIDE 0.9 % IV BOLUS
1000.0000 mL | Freq: Once | INTRAVENOUS | Status: AC
  Administered 2019-02-24: 1000 mL via INTRAVENOUS

## 2019-02-24 NOTE — ED Notes (Signed)
Attempted to ambulate pt. Pt able to stand with maximum assist. Pt unable to take steps. This tech and Caryl Pina, RN assisted pt to use urinal. Pt voided 100 mL of urine. Will continue to monitor as 1:1 sitter.

## 2019-02-24 NOTE — ED Notes (Signed)
Pt ambulated with walker w/o difficulty. Pt states he has a walker at home and that he uses it.

## 2019-02-24 NOTE — Discharge Instructions (Addendum)
Please seek medical attention for any high fevers, chest pain, shortness of breath, change in behavior, persistent vomiting, bloody stool or any other new or concerning symptoms.  

## 2019-02-24 NOTE — ED Notes (Signed)
Sitter at bedside.

## 2019-02-24 NOTE — ED Notes (Signed)
This tech and Maudie Mercury, Rn ambulated pt. Pt needed minimal to standby assistance. Pt ambulated with walker in the room. Pt was offered he restroom but stated he didn't need to use it.

## 2019-02-24 NOTE — ED Notes (Signed)
Patient witness falling while ambulating with wife to floor. Patient struck head on floor during fall.   MD had been walking past room and saw patient and wife leaving. MD asked patient where he was going as he was not discharged and patient fell before he could answer. Patient assisted back to bed by this RN, MD Karma Greaser and Judeen Hammans RN. CT scan ordered. Patient denied injury. Patient reoriented to call bell. Patient hooked back up to monitoring devices. Sitter ordered - sitter to bedside.

## 2019-02-24 NOTE — ED Notes (Signed)
Sitter at bedside; returned from scan

## 2019-02-24 NOTE — ED Notes (Signed)
Pt verbalized understanding of discharge instructions. NAD at this time. 

## 2019-02-24 NOTE — ED Notes (Addendum)
Patient transported to CT 

## 2019-02-24 NOTE — ED Notes (Addendum)
This tech took over pt as 1:1 sitter. Pt currently sleeping at this time.

## 2019-02-24 NOTE — ED Notes (Signed)
This RN into room to give wife a pair of blue paper scrubs for the patient to wear home at discharge. On entering the room pt noted to be sitting on the floor with the wife standing beside him. Pt and wife deny injury and wife unable to describe how the patient ended up on the floor. Pt noted to be covered in stool. Dr Jari Pigg in and assessed pt and assisted me in getting the patient back on the bed.

## 2019-02-24 NOTE — ED Notes (Signed)
Patient transported to MRI 

## 2019-12-30 ENCOUNTER — Emergency Department
Admission: EM | Admit: 2019-12-30 | Discharge: 2020-01-04 | Disposition: A | Discharge status: Home / Self Care | Payer: Medicare PPO | Attending: Emergency Medicine | Admitting: Emergency Medicine

## 2019-12-30 ENCOUNTER — Emergency Department: Payer: Medicare PPO

## 2019-12-30 DIAGNOSIS — F17210 Nicotine dependence, cigarettes, uncomplicated: Secondary | ICD-10-CM | POA: Diagnosis not present

## 2019-12-30 DIAGNOSIS — R410 Disorientation, unspecified: Secondary | ICD-10-CM | POA: Diagnosis not present

## 2019-12-30 DIAGNOSIS — R296 Repeated falls: Secondary | ICD-10-CM | POA: Insufficient documentation

## 2019-12-30 DIAGNOSIS — R4182 Altered mental status, unspecified: Secondary | ICD-10-CM | POA: Diagnosis present

## 2019-12-30 DIAGNOSIS — R262 Difficulty in walking, not elsewhere classified: Secondary | ICD-10-CM | POA: Insufficient documentation

## 2019-12-30 DIAGNOSIS — M6281 Muscle weakness (generalized): Secondary | ICD-10-CM | POA: Diagnosis not present

## 2019-12-30 DIAGNOSIS — E119 Type 2 diabetes mellitus without complications: Secondary | ICD-10-CM | POA: Diagnosis not present

## 2019-12-30 DIAGNOSIS — I10 Essential (primary) hypertension: Secondary | ICD-10-CM | POA: Insufficient documentation

## 2019-12-30 DIAGNOSIS — Z20822 Contact with and (suspected) exposure to covid-19: Secondary | ICD-10-CM | POA: Diagnosis not present

## 2019-12-30 DIAGNOSIS — R2681 Unsteadiness on feet: Secondary | ICD-10-CM | POA: Insufficient documentation

## 2019-12-30 LAB — COMPREHENSIVE METABOLIC PANEL
ALT: 13 U/L (ref 0–44)
AST: 22 U/L (ref 15–41)
Albumin: 3.3 g/dL — ABNORMAL LOW (ref 3.5–5.0)
Alkaline Phosphatase: 44 U/L (ref 38–126)
Anion gap: 8 (ref 5–15)
BUN: 8 mg/dL (ref 8–23)
CO2: 25 mmol/L (ref 22–32)
Calcium: 8.7 mg/dL — ABNORMAL LOW (ref 8.9–10.3)
Chloride: 109 mmol/L (ref 98–111)
Creatinine, Ser: 0.69 mg/dL (ref 0.61–1.24)
GFR, Estimated: >60 mL/min (ref >60)
Glucose, Bld: 101 mg/dL — ABNORMAL HIGH (ref 70–99)
Potassium: 3.2 mmol/L — ABNORMAL LOW (ref 3.5–5.1)
Sodium: 142 mmol/L (ref 135–145)
Total Bilirubin: 0.7 mg/dL (ref 0.3–1.2)
Total Protein: 6.3 g/dL — ABNORMAL LOW (ref 6.5–8.1)

## 2019-12-30 LAB — URINE DRUG SCREEN, QUALITATIVE (ARMC ONLY)
Amphetamines, Ur Screen: NOT DETECTED
Barbiturates, Ur Screen: NOT DETECTED
Benzodiazepine, Ur Scrn: NOT DETECTED
Cannabinoid 50 Ng, Ur ~~LOC~~: NOT DETECTED
Cocaine Metabolite,Ur ~~LOC~~: NOT DETECTED
MDMA (Ecstasy)Ur Screen: NOT DETECTED
Methadone Scn, Ur: NOT DETECTED
Opiate, Ur Screen: NOT DETECTED
Phencyclidine (PCP) Ur S: NOT DETECTED
Tricyclic, Ur Screen: NOT DETECTED

## 2019-12-30 LAB — CBC WITH DIFFERENTIAL/PLATELET
Abs Immature Granulocytes: 0.02 10*3/uL (ref 0.00–0.07)
Basophils Absolute: 0 10*3/uL (ref 0.0–0.1)
Basophils Relative: 0 %
Eosinophils Absolute: 0.2 10*3/uL (ref 0.0–0.5)
Eosinophils Relative: 5 %
HCT: 36.1 % — ABNORMAL LOW (ref 39.0–52.0)
Hemoglobin: 11.8 g/dL — ABNORMAL LOW (ref 13.0–17.0)
Immature Granulocytes: 1 %
Lymphocytes Relative: 34 %
Lymphs Abs: 1.5 10*3/uL (ref 0.7–4.0)
MCH: 33.1 pg (ref 26.0–34.0)
MCHC: 32.7 g/dL (ref 30.0–36.0)
MCV: 101.1 fL — ABNORMAL HIGH (ref 80.0–100.0)
Monocytes Absolute: 0.5 10*3/uL (ref 0.1–1.0)
Monocytes Relative: 12 %
Neutro Abs: 2.1 10*3/uL (ref 1.7–7.7)
Neutrophils Relative %: 48 %
Platelets: 141 10*3/uL — ABNORMAL LOW (ref 150–400)
RBC: 3.57 MIL/uL — ABNORMAL LOW (ref 4.22–5.81)
RDW: 14.9 % (ref 11.5–15.5)
WBC: 4.3 10*3/uL (ref 4.0–10.5)
nRBC: 0 % (ref 0.0–0.2)

## 2019-12-30 LAB — URINALYSIS, COMPLETE (UACMP) WITH MICROSCOPIC
Bacteria, UA: NONE SEEN
Bilirubin Urine: NEGATIVE
Glucose, UA: NEGATIVE mg/dL
Hgb urine dipstick: NEGATIVE
Ketones, ur: NEGATIVE mg/dL
Leukocytes,Ua: NEGATIVE
Nitrite: NEGATIVE
Protein, ur: NEGATIVE mg/dL
Specific Gravity, Urine: 1.025 (ref 1.005–1.030)
Squamous Epithelial / HPF: NONE SEEN (ref 0–5)
pH: 5 (ref 5.0–8.0)

## 2019-12-30 LAB — ETHANOL: Alcohol, Ethyl (B): <10 mg/dL (ref <10)

## 2019-12-30 LAB — RESPIRATORY PANEL BY RT PCR (FLU A&B, COVID)
Influenza A by PCR: NEGATIVE
Influenza B by PCR: NEGATIVE
SARS Coronavirus 2 by RT PCR: NEGATIVE

## 2019-12-30 LAB — AMMONIA: Ammonia: 11 umol/L (ref 9–35)

## 2019-12-30 LAB — LACTIC ACID, PLASMA: Lactic Acid, Venous: 1.1 mmol/L (ref 0.5–1.9)

## 2019-12-30 MED ORDER — THIAMINE HCL 100 MG/ML IJ SOLN
100.0000 mg | Freq: Once | INTRAMUSCULAR | Status: AC
  Administered 2019-12-30: 100 mg via INTRAVENOUS
  Filled 2019-12-30: qty 2

## 2019-12-30 MED ORDER — PANTOPRAZOLE SODIUM 40 MG PO TBEC
40.0000 mg | DELAYED_RELEASE_TABLET | Freq: Every day | ORAL | Status: DC
  Administered 2019-12-31 – 2020-01-04 (×5): 40 mg via ORAL
  Filled 2019-12-30 (×4): qty 1

## 2019-12-30 MED ORDER — TAMSULOSIN HCL 0.4 MG PO CAPS
0.4000 mg | ORAL_CAPSULE | Freq: Every day | ORAL | Status: DC
  Administered 2019-12-31 – 2020-01-04 (×5): 0.4 mg via ORAL
  Filled 2019-12-30 (×5): qty 1

## 2019-12-30 MED ORDER — DROPERIDOL 2.5 MG/ML IJ SOLN
2.5000 mg | Freq: Once | INTRAMUSCULAR | Status: AC
  Administered 2019-12-30: 2.5 mg via INTRAMUSCULAR
  Filled 2019-12-30: qty 2

## 2019-12-30 MED ORDER — DONEPEZIL HCL 5 MG PO TABS
10.0000 mg | ORAL_TABLET | Freq: Every day | ORAL | Status: DC
  Administered 2019-12-30 – 2020-01-03 (×5): 10 mg via ORAL
  Filled 2019-12-30 (×5): qty 2

## 2019-12-30 MED ORDER — MIRTAZAPINE 15 MG PO TABS
15.0000 mg | ORAL_TABLET | Freq: Every day | ORAL | Status: DC
  Administered 2019-12-30 – 2020-01-03 (×5): 15 mg via ORAL
  Filled 2019-12-30 (×5): qty 1

## 2019-12-30 NOTE — ED Provider Notes (Signed)
-----------------------------------------   3:17 PM on 12/30/2019 -----------------------------------------  Patient care assumed from Dr. Kerman Passey.  Patient initially presented for altered mental status but work-up in the ED has been unremarkable.  We are currently awaiting arrival of stepdaughter here to help determine whether he is at his baseline and appropriate for discharge home.  Otherwise, we will engage social work and PT for potential placement.  ----------------------------------------- 7:30 PM on 12/30/2019 -----------------------------------------  Patient becoming increasingly agitated and insisting on going home to check on his children despite multiple attempts at reorientation.  He clearly lacks capacity at this time and in order to ensure his safety as well as the safety of staff we will medicate with IM droperidol.  We will screen EKG to ensure no QT prolongation as well.  ----------------------------------------- 11:30 PM on 12/30/2019 -----------------------------------------  Patient resting comfortably at this time, agitation improved following dose of IM droperidol.  EKG shows no evidence of QT prolongation.  Patient continues to await further assistance from social work.  ED ECG REPORT I, Blake Divine, the attending physician, personally viewed and interpreted this ECG.   Date: 12/30/2019  EKG Time: 19:33  Rate: 79  Rhythm: normal sinus rhythm, sinus arrhythmia  Axis: Normal  Intervals:none  ST&T Change: None    Blake Divine, MD 12/30/19 2330

## 2019-12-30 NOTE — ED Provider Notes (Signed)
[facility] Emergency Department Provider Note  ____________________________________________   First MD Initiated Contact with Patient 12/30/19 [phone]     (approximate)  I have reviewed the triage vital signs and the nursing notes.   HISTORY  Chief Complaint Altered Mental Status  Level 5 caveat:  history/ROS limited by altered mental status/confusion   HPI Peter Howard. is a 74 y.o. male with medical history as listed below who presents by EMS for evaluation of altered mental status.  His baseline mental status is unknown.  He is not able to provide any history.   Reportedly the patient was found confused and away from home on someone else's property.  He was incontinent of urine and feces and had to be cleaned up by the ED nursing staff.  He is oriented to self and is awake but is minimally communicative and minimally able to participate in any sort of examination or history.  He denies pain and he denies shortness of breath, otherwise he is unable to provide history or answers.        Past Medical History:  Diagnosis Date  . BPH (benign prostatic hypertrophy)   . Diabetes mellitus without complication ([place])   . Glaucoma   . Hyperlipidemia   . Hypertension   . Peripheral vascular disease ([place])   . Stroke ([place])   . TIA (transient ischemic attack)   . Tubular adenoma of colon     There are no problems to display for this patient.   Past Surgical History:  Procedure Laterality Date  . COLONOSCOPY    . COLONOSCOPY N/A 03/13/2015   Procedure: COLONOSCOPY;  Surgeon: Hulen Luster, MD;  Location: [facility] ENDOSCOPY;  Service: Gastroenterology;  Laterality: N/A;  DIABETIC  . EYE SURGERY     glaucoma  . FOOT SURGERY    . HERNIA REPAIR    . INSERTION OF MESH Right 11/10/2018   Procedure: INSERTION OF MESH;  Surgeon: Benjamine Sprague, DO;  Location: [facility] ORS;  Service: General;  Laterality: Right;  . ROTATOR CUFF REPAIR    . XI ROBOTIC ASSISTED INGUINAL  HERNIA REPAIR WITH MESH Right 11/10/2018   Procedure: XI ROBOTIC ASSISTED RIGHT  INGUINAL HERNIA REPAIR;  Surgeon: Benjamine Sprague, DO;  Location: [facility] ORS;  Service: General;  Laterality: Right;    Prior to Admission medications   Medication Sig Start Date End Date Taking? Authorizing Provider  brimonidine (ALPHAGAN) 0.2 % ophthalmic solution Place 1 drop into both eyes 3 (three) times daily.    [provider]  Cholecalciferol (VITAMIN D) 50 MCG (2000 UT) CAPS Take 2,000 Units by mouth daily.    [provider]  donepezil (ARICEPT) 10 MG tablet Take 10 mg by mouth at bedtime.    [provider]  dorzolamide-timolol (COSOPT) 22.3-6.8 MG/ML ophthalmic solution Place 1 drop into both eyes 2 (two) times daily.     [provider]  ipratropium (ATROVENT) 0.06 % nasal spray Place 2 sprays into both nostrils daily as needed for rhinitis.    [provider]  mirtazapine (REMERON) 15 MG tablet Take 15 mg by mouth at bedtime.    [provider]  omeprazole (PRILOSEC) 40 MG capsule Take 40 mg by mouth daily.     [provider]  tamsulosin (FLOMAX) 0.4 MG CAPS capsule Take 0.4 mg by mouth daily.     [provider]  vitamin B-12 (CYANOCOBALAMIN) 1000 MCG tablet Take 1,000 mcg by mouth daily.    [provider]  Allergies Penicillins  No family history on file.  Social History Social History   Tobacco Use  . Smoking status: Current Some Day Smoker    Types: Cigarettes  . Smokeless tobacco: Never Used  Vaping Use  . Vaping Use: Never used  Substance Use Topics  . Alcohol use: Yes  . Drug use: No    Review of Systems Level 5 caveat:  history/ROS limited by altered mental status/confusion  ____________________________________________   PHYSICAL EXAM:  VITAL SIGNS: ED Triage Vitals  Enc Vitals Group     BP 12/30/19 0503 140/80     Pulse Rate 12/30/19 0503 68     Resp 12/30/19 0503 (!) 21     Temp  12/30/19 0503 98.6 F (37 C)     Temp Source 12/30/19 0503 Oral     SpO2 12/30/19 0502 97 %     Weight 12/30/19 0507 65.8 kg (145 lb)     Height 12/30/19 0507 1.727 m (5\' 8" )     Head Circumference --      Peak Flow --      Pain Score 12/30/19 0506 0     Pain Loc --      Pain Edu? --      Excl. in [place]? --     Constitutional: Alert and oriented to self only. Eyes: Conjunctivae are normal.  Pupils are equal and reactive. Head: Atraumatic. Nose: No congestion/rhinnorhea. Mouth/Throat: Patient is wearing a mask. Neck: No stridor.  No meningeal signs.   Cardiovascular: Normal rate, regular rhythm. Good peripheral circulation. Grossly normal heart sounds. Respiratory: Normal respiratory effort.  No retractions. Gastrointestinal: Soft and nontender. No distention.  Musculoskeletal: No lower extremity tenderness nor edema. No gross deformities of extremities. Neurologic:  Normal speech and language. No gross focal neurologic deficits are appreciated.  Skin:  Skin is warm, dry and intact.   ____________________________________________   LABS (all labs ordered are listed, but only abnormal results are displayed)  Labs Reviewed  COMPREHENSIVE METABOLIC PANEL - Abnormal; Notable for the following components:      Result Value   Potassium 3.2 (*)    Glucose, Bld 101 (*)    Calcium 8.7 (*)    Total Protein 6.3 (*)    Albumin 3.3 (*)    All other components within normal limits  URINALYSIS, COMPLETE (UACMP) WITH MICROSCOPIC - Abnormal; Notable for the following components:   Color, Urine YELLOW (*)    APPearance CLEAR (*)    All other components within normal limits  CBC WITH DIFFERENTIAL/PLATELET - Abnormal; Notable for the following components:   RBC 3.57 (*)    Hemoglobin 11.8 (*)    HCT 36.1 (*)    MCV 101.1 (*)    Platelets 141 (*)    All other components within normal limits  RESPIRATORY PANEL BY RT PCR (FLU A&B, COVID)  LACTIC ACID, PLASMA  ETHANOL  URINE DRUG SCREEN,  QUALITATIVE ([facility] ONLY)  CBC WITH DIFFERENTIAL/PLATELET  AMMONIA   ____________________________________________  EKG  ED ECG REPORT I, Hinda Kehr, the attending physician, personally viewed and interpreted this ECG.  Date: 12/30/2019 EKG Time: 5:09 AM Rate: 67 Rhythm: normal sinus rhythm QRS Axis: Borderline right axis deviation Intervals: normal ST/T Wave abnormalities: Non-specific ST segment / T-wave changes, but no clear evidence of acute ischemia. Narrative Interpretation: no definitive evidence of acute ischemia; does not meet STEMI criteria.   ____________________________________________  RADIOLOGY I, Hinda Kehr, personally viewed and evaluated these images (plain radiographs) as part of my  medical decision making, as well as reviewing the written report by the radiologist.  ED MD interpretation: No acute abnormalities identified on CT head, just chronic changes.  Official radiology report(s): CT Head Wo Contrast  Result Date: 12/30/2019 CLINICAL DATA:  Encephalopathy EXAM: CT HEAD WITHOUT CONTRAST TECHNIQUE: Contiguous axial images were obtained from the base of the skull through the vertex without intravenous contrast. COMPARISON:  02/24/2019 FINDINGS: Brain: There is no mass, hemorrhage or extra-axial collection. There is generalized atrophy without lobar predilection. Hypodensity of the white matter is most commonly associated with chronic microvascular disease. Vascular: No abnormal hyperdensity of the major intracranial arteries or dural venous sinuses. No intracranial atherosclerosis. Skull: The visualized skull base, calvarium and extracranial soft tissues are normal. Sinuses/Orbits: No fluid levels or advanced mucosal thickening of the visualized paranasal sinuses. No mastoid or middle ear effusion. The orbits are normal. IMPRESSION: Generalized atrophy and chronic microvascular ischemia without acute intracranial abnormality. Electronically Signed   By: Ulyses Jarred M.D.   On: 12/30/2019 06:44    ____________________________________________   PROCEDURES   Procedure(s) performed (including Critical Care):  Procedures   ____________________________________________   INITIAL IMPRESSION / MDM / [place] / ED COURSE  As part of my medical decision making, I reviewed the following data within the [place] notes reviewed and incorporated, Labs reviewed , EKG interpreted , Old chart reviewed, Patient signed out to Dr. Kerman Passey, Radiograph reviewed , Notes from prior ED visits and [place] Controlled Substance Database   Differential diagnosis includes, but is not limited to, acute infection such as UTI, medication or drug side effect, metabolic or electrolyte abnormality, CVA, acute intracranial hemorrhage, less likely ACS.  The patient denies pain and is able to do so repeatedly and emphatically.  He also denies shortness of breath and has no respiratory difficulties.  Vital signs are all stable and within normal limits including afebrile and oxygen saturation of 99 to 100% on room air.  He has not hypotensive and not tachycardic.  It is unclear what is his baseline in terms of mental status and/or dementia.  I will investigate with a chart review while performing a general altered mental status work-up including CT head and labs.  Given his incontinence and disheveled initial state, I have asked the nurses to perform an in and out catheterization for urine.  His EKG shows no signs of ischemia and he is not having chest pain so there is no indication for a high-sensitivity troponin.  I am also checking an ethanol level given the concern of altered mental status but he does not smell of alcohol and I think this is less likely but important to rule out.  However, the patient is on the cardiac monitor to evaluate for evidence of arrhythmia and/or significant heart rate changes.     Clinical Course as of Dec 30 738    Nancy Fetter Dec 30, 2019  0620 Upon further review of the patient's chart, he apparently has a substantial alcohol abuse history.  I have added on an ammonia level.  This may be some mild age-related or vascular dementia on top of acute alcohol intoxication.  I am also adding on a urine drug screen although this seems less likely.   [CF]  [phone] Patient's work-up is generally reassuring with no substantial emergent findings on comprehensive metabolic panel, urinalysis, urine drug screen, CBC, normal lactic acid.  The patient's ethanol level is negative as well and he shows no physical signs or  symptoms of withdrawal.  Given that I verified in the history that he has a history of alcohol abuse, I am providing a dose of thiamine 100 mg IV.  I also personally reviewed the CT scan and the results from the radiologist and there is no acute or emergent abnormalities.    [CF]  9233 However I reassessed the patient and he still seems completely altered.  He again confirms that he has no pain but he cannot give me any history at all of what happened tonight, where he was, where he is, etc.  This may be worsening dementia but I cannot find a specific source of delirium.  The ammonia test hemolyzed so the lab was sitting down so I am to redraw.  At this point I am transferring ED care to Dr. Kerman Passey to reassess and determine at that time if he meets any inpatient criteria or if he will require social work assistance.  Of note, I tried contacting the patient's spouse at the number listed in the computer and the number is apparently out of service and no other contact information is listed.   [CF]    Clinical Course User Index [CF] Hinda Kehr, MD     ____________________________________________  FINAL CLINICAL IMPRESSION(S) / ED DIAGNOSES  Final diagnoses:  Confusion     MEDICATIONS GIVEN DURING THIS VISIT:  Medications  thiamine (B-1) injection 100 mg (has no administration in time range)     ED  Discharge Orders    None      *Please note:  Peter Howard. was evaluated in Emergency Department on 12/30/2019 for the symptoms described in the history of present illness. He was evaluated in the context of the global COVID-19 pandemic, which necessitated consideration that the patient might be at risk for infection with the SARS-CoV-2 virus that causes COVID-19. Institutional protocols and algorithms that pertain to the evaluation of patients at risk for COVID-19 are in a state of rapid change based on information released by regulatory bodies including the CDC and federal and state organizations. These policies and algorithms were followed during the patient's care in the ED.  Some ED evaluations and interventions may be delayed as a result of limited staffing during and after the pandemic.*  Note:  This document was prepared using Dragon voice recognition software and may include unintentional dictation errors.   Hinda Kehr, MD 12/30/19 [phone]

## 2019-12-30 NOTE — TOC Progression Note (Addendum)
Transition of Care (TOC) - Progression Note    Patient Details  Name: Peter Howard. MRN: [id number] Date of Birth: 06/06/1945  Transition of Care [facility]) CM/SW Contact  Berenice Bouton, LCSW Phone Number: 12/30/2019, 10:29 AM  Clinical Narrative:   As of 8916 hour waiting for [place] DSS APS - searching for the patient's family.   1045: SW received call from [place] worker stating that "there is an open APS case on Mr. Peter Howard."  APS worker attempted to contact his daughter but there was no answer.  They are not able to provide undersigned clinician with the patient's daughter's phone number due to "guardianship issues."  1020 TOC CM/SW received call from [place].  DSS is familiar with this patient. On call social worker will search for the patient patient's daughter/family.   SW waiting for call back from [place].    Expected Discharge Plan and Services     Social Determinants of Health (SDOH) Interventions   Readmission Risk Interventions No flowsheet data found.

## 2019-12-30 NOTE — Social Work (Signed)
TOC CM/SW place a call to on-call [facility] Adult [employer] service Social worker.

## 2019-12-30 NOTE — ED Provider Notes (Signed)
-----------------------------------------   10:52 AM on 12/30/2019 -----------------------------------------  Patient care assumed from Dr. Karma Greaser.  Patient's work-up is essentially negative.  Patient appears to have a history of dementia but we are unable to confirm this until I spoke to the stepdaughter who states the patient has been diagnosed with early dementia.  She is going to come to the emergency department between 1 PM and 2 PM so we can discuss this further as well as next steps.  Social work is consulted as well.  From a medical perspective I see no reason to admit to the hospital at this time, highly suspect this is dementia behavioral disturbance.  We will speak to the daughter when she arrives.  Her phone number is [phone].  We continue to wait for family arrival.  Patient care signed out to oncoming physician.   Harvest Dark, MD 12/30/19 1450

## 2019-12-30 NOTE — ED Notes (Signed)
Assumed care of pt upon being roomed, pt resting with eyes closed, awakens to application of pulse oximetry monitor. Wet cough noted. Pt not speaking to RN to answer orientation questions.

## 2019-12-30 NOTE — Evaluation (Signed)
Physical Therapy Evaluation Patient Details Name: Peter Howard. MRN: [id number] DOB: 1945-04-04 Today's Date: 12/30/2019   History of Present Illness  Pt is a 74 yo male with PMH that includes DM, HLD, HTN, PVD, CVA, TIA, and tubular adenoma of the colon.  Pt presented to the ED with AMS. Reportedly the patient was found confused and away from home on someone else's property.    Clinical Impression  Pt was pleasant and motivated to participate during the session.  Pt required extra time and effort with bed mobility tasks but ultimately was able to come to sitting without physical assistance.  Upon coming to standing pt immediately lost balance posteriorly requiring Mod A to prevent posterior LOB.  Pt continued to require physical assistance to prevent LOB during amb and walked with a festinating gait pattern that only briefly improved with cuing.  Overall pt is at a very high risk for falls and indeed endorsed a significant fall history over the last 6 months. Pt will benefit from PT services in a SNF setting upon discharge to safely address deficits listed in patient problem list for decreased caregiver assistance and eventual return to PLOF.       Follow Up Recommendations SNF    Equipment Recommendations  Rolling walker with 5" wheels    Recommendations for Other Services       Precautions / Restrictions Precautions Precautions: Fall Restrictions Weight Bearing Restrictions: No      Mobility  Bed Mobility Overal bed mobility: Modified Independent             General bed mobility comments: Extra time and effort required    Transfers Overall transfer level: Needs assistance Equipment used: Rolling walker (2 wheeled) Transfers: Sit to/from Stand Sit to Stand: Mod assist         General transfer comment: Mod A to prevent posterior LOB upon standing with min verbal cues for sequencing  Ambulation/Gait Ambulation/Gait assistance: Min assist Gait Distance  (Feet): 15 Feet x 2 Assistive device: Rolling walker (2 wheeled) Gait Pattern/deviations: Step-through pattern;Decreased step length - right;Decreased step length - left;Festinating Gait velocity: decreased   General Gait Details: Pt required min A for stability during ambulation with very short, festinating gait pattern; pt able to increase step length with cuing but quickly reverted back to festinating pattern  Stairs            Wheelchair Mobility    Modified Rankin (Stroke Patients Only)       Balance Overall balance assessment: Needs assistance   Sitting balance-Leahy Scale: Fair     Standing balance support: Bilateral upper extremity supported;During functional activity Standing balance-Leahy Scale: Poor Standing balance comment: Pt required physical assistance to prevent posterior LOB in standing                             Pertinent Vitals/Pain Pain Assessment: No/denies pain    Home Living Family/patient expects to be discharged to:: Private residence Living Arrangements: Spouse/significant other Available Help at Discharge: Family;Available 24 hours/day Type of Home: House Home Access: Level entry     Home Layout: One level Home Equipment: Cane - single point Additional Comments: No family present to verify history    Prior Function Level of Independence: Independent with assistive device(s)   Gait / Transfers Assistance Needed: Mod Ind amb community distances with a SPC; pt endorses "at least 5-6" falls in the last 6 months secondary to LOB  ADL's /  Homemaking Assistance Needed: Ind with ADLs  Comments: No family present to verify PLOF     Hand Dominance        Extremity/Trunk Assessment   Upper Extremity Assessment Upper Extremity Assessment: Generalized weakness    Lower Extremity Assessment Lower Extremity Assessment: Generalized weakness       Communication   Communication: No difficulties  Cognition  Arousal/Alertness: Awake/alert Behavior During Therapy: WFL for tasks assessed/performed Overall Cognitive Status: No family/caregiver present to determine baseline cognitive functioning                                 General Comments: Pt able to follow 1-step commands consistently      General Comments      Exercises     Assessment/Plan    PT Assessment Patient needs continued PT services  PT Problem List Decreased strength;Decreased activity tolerance;Decreased balance;Decreased mobility;Decreased knowledge of use of DME       PT Treatment Interventions DME instruction;Gait training;Functional mobility training;Therapeutic activities;Therapeutic exercise;Balance training;Patient/family education    PT Goals (Current goals can be found in the Care Plan section)  Acute Rehab PT Goals Patient Stated Goal: To walk better PT Goal Formulation: With patient Time For Goal Achievement: 01/12/20 Potential to Achieve Goals: Fair    Frequency Min 2X/week   Barriers to discharge        Co-evaluation               AM-PAC PT "6 Clicks" Mobility  Outcome Measure Help needed turning from your back to your side while in a flat bed without using bedrails?: None Help needed moving from lying on your back to sitting on the side of a flat bed without using bedrails?: None Help needed moving to and from a bed to a chair (including a wheelchair)?: A Lot Help needed standing up from a chair using your arms (e.g., wheelchair or bedside chair)?: A Lot Help needed to walk in hospital room?: A Lot Help needed climbing 3-5 steps with a railing? : A Lot 6 Click Score: 16    End of Session Equipment Utilized During Treatment: Gait belt Activity Tolerance: Patient tolerated treatment well Patient left: in bed;with call bell/phone within reach;with bed alarm set Nurse Communication: Mobility status PT Visit Diagnosis: Unsteadiness on feet (R26.81);Repeated falls  (R29.6);Muscle weakness (generalized) (M62.81);Difficulty in walking, not elsewhere classified (R26.2)    Time: 1497-0263 PT Time Calculation (min) (ACUTE ONLY): 25 min   Charges:   PT Evaluation $PT Eval Moderate Complexity: 1 Mod          D. Scott Manning Luna PT, DPT 12/30/19, 5:01 PM

## 2019-12-30 NOTE — ED Triage Notes (Signed)
PT was found confused away from home covered in incontinence. Pt alert and oriented to self only.

## 2019-12-31 NOTE — ED Notes (Signed)
Pt asked for phone to call his wife, stating he did not have the number; called the number in the chart and it is no longer in service.

## 2019-12-31 NOTE — ED Notes (Signed)
Pt awakened; oriented to self; not to place or situation. States he is "at your house." Says he feels "better."

## 2019-12-31 NOTE — ED Notes (Addendum)
2 social workers for [place] arrived to see pt. He was alert & awake when they were in the room, has been incontinent of urine and will be changed. Social workers state that wife is unable to care for pt, and his stepdaughter has said she is not going to care for him. Also, DIL is out of state and unable to care for pt as well. They will be in touch with our social workers in order to coordinate safe discharge for pt.

## 2019-12-31 NOTE — NC FL2 (Signed)
[place] LEVEL OF CARE SCREENING TOOL     IDENTIFICATION  Patient Name: Peter Howard. Birthdate: 1945/07/11 Sex: male Admission Date (Current Location): 12/30/2019  [place] and [place] Number:  Engineering geologist and Address:  [facility], [address], [place], [place] [address]      Provider Number:    Attending Physician Name and Address:  No att. providers found  Relative Name and Phone Number:  Riley, Hallum (Spouse) [phone]  or Tressa Busman step-daughter [phone]    Current Level of Care: Hospital Recommended Level of Care: [place] Prior Approval Number:    Date Approved/Denied:   PASRR Number: [id number]  Discharge Plan: SNF    Current Diagnoses: There are no problems to display for this patient.   Orientation RESPIRATION BLADDER Height & Weight     Self, Situation, Place  Normal Continent Weight: 145 lb (65.8 kg) Height:  5\' 8"  (172.7 cm)  BEHAVIORAL SYMPTOMS/MOOD NEUROLOGICAL BOWEL NUTRITION STATUS      Continent Diet  AMBULATORY STATUS COMMUNICATION OF NEEDS Skin   Limited Assist Verbally Normal                       Personal Care Assistance Level of Assistance  Bathing, Feeding, Dressing Bathing Assistance: Limited assistance Feeding assistance: Limited assistance Dressing Assistance: Limited assistance     Functional Limitations Info  Sight, Hearing, Speech Sight Info: Adequate Hearing Info: Adequate Speech Info: Adequate    SPECIAL CARE FACTORS FREQUENCY       PT 5X a week                Contractures Contractures Info: Not present    Additional Factors Info                  Current Medications (12/31/2019):  This is the current hospital active medication list Current Facility-Administered Medications  Medication Dose Route Frequency Provider Last Rate Last Admin  . donepezil (ARICEPT) tablet 10 mg  10 mg Oral QHS Harvest Dark, MD    10 mg at 12/30/19 2330  . mirtazapine (REMERON) tablet 15 mg  15 mg Oral QHS Harvest Dark, MD   15 mg at 12/30/19 2330  . pantoprazole (PROTONIX) EC tablet 40 mg  40 mg Oral Daily Harvest Dark, MD   40 mg at 12/31/19 1022  . tamsulosin (FLOMAX) capsule 0.4 mg  0.4 mg Oral Daily Harvest Dark, MD   0.4 mg at 12/31/19 1020   Current Outpatient Medications  Medication Sig Dispense Refill  . tamsulosin (FLOMAX) 0.4 MG CAPS capsule Take 0.4 mg by mouth daily.     . brimonidine (ALPHAGAN) 0.2 % ophthalmic solution Place 1 drop into both eyes 3 (three) times daily.    . Cholecalciferol (VITAMIN D) 50 MCG (2000 UT) CAPS Take 2,000 Units by mouth daily.    Marland Kitchen donepezil (ARICEPT) 10 MG tablet Take 10 mg by mouth at bedtime.    . dorzolamide-timolol (COSOPT) 22.3-6.8 MG/ML ophthalmic solution Place 1 drop into both eyes 2 (two) times daily.     Marland Kitchen ipratropium (ATROVENT) 0.06 % nasal spray Place 2 sprays into both nostrils daily as needed for rhinitis.    . mirtazapine (REMERON) 15 MG tablet Take 15 mg by mouth at bedtime.    Marland Kitchen omeprazole (PRILOSEC) 40 MG capsule Take 40 mg by mouth daily.     . vitamin B-12 (CYANOCOBALAMIN) 1000 MCG tablet Take 1,000 mcg by mouth daily.  Discharge Medications: Please see discharge summary for a list of discharge medications.  Relevant Imaging Results:  Relevant Lab Results:   Additional Information SS# [id number]  Adelene Amas, LCSWA

## 2019-12-31 NOTE — TOC Initial Note (Addendum)
Transition of Care (TOC) - Initial/Assessment Note    Patient Details  Name: Peter Howard. MRN: [id number] Date of Birth: Jan 04, 1946  Transition of Care [facility]) CM/SW Contact:    Peter Howard Phone Number: [phone] 12/31/2019, 5:18 PM  Clinical Narrative:                  CSW spoke with patient and explained role of TOC in patient care.  Patient states he is able to perform all ADLs and drives himself to doctor's appointments.  Patient stated sometimes he is forgetful but he is doing well.  Patient states he does not drink alcohol or take recreation al drugs.  Patient stated he is continent. Patietn stated he was agreeable to go to a SNF placement. Patient stated he did not have a choice of SNF.  CSW gave him choice info, patient stated that CSW can choose for him.  CSW spoke with patient's step-daughter Peter Howard.  Ms. Celedonio Miyamoto stated the patient is not abel to take care of himself and is often incontinent because he refuses to get up and go to the bathroom or wear an adult diaper.  Ms. Celedonio Miyamoto stated she believes the patein going to a SNF placement is a good idea because she has noticed he falls more often and is weaker.  Ms. Celedonio Miyamoto stated she is the primary care giver for her mother (the patient's wife) and she cannot take care of both of them and she needs the patient to get stronger and then return home.   FL2 sent via the Myrtie Cruise #[id number]  Expected Discharge Plan: [place] Barriers to Discharge: SNF Pending bed offer, ED Patient Insisting on an Alternate Living Situation/Facility, ED Unsafe disposition, ED SNF auth, ED Facility/Family Refusing to Allow Patient to Return   Patient Goals and CMS Choice Patient states their goals for this hospitalization and ongoing recovery are:: Patient stated he is ok with going to a SNF.  [facility] APS involved. CMS Medicare.gov Compare Post Acute Care list provided to:: Patient Choice offered to / list  presented to : Patient  Expected Discharge Plan and Services Expected Discharge Plan: [place] In-house Referral: Clinical Social Work   Post Acute Care Choice: [place] Living arrangements for the past 2 months: [place]                                      Prior Living Arrangements/Services Living arrangements for the past 2 months: Single Family Home Lives with:: Spouse Patient language and need for interpreter reviewed:: Yes Do you feel safe going back to the place where you live?: Yes      Need for Family Participation in Patient Care: Yes (Comment) Care giver support system in place?: Yes (comment)   Criminal Activity/Legal Involvement Pertinent to Current Situation/Hospitalization: No - Comment as needed  Activities of Daily Living      Permission Sought/Granted Permission sought to share information with : Family Supports (Step-daughter [place] [phone])    Share Information with NAME: Step-daughter Peter Howard [phone]           Emotional Assessment Appearance:: Appears stated age Attitude/Demeanor/Rapport: Lethargic, Inconsistent Affect (typically observed): Withdrawn Orientation: : Oriented to Self, Oriented to Place, Oriented to Situation Alcohol / Substance Use: Not Applicable Psych Involvement: No (comment)  Admission diagnosis:  EMS There are no problems to display for  this patient.  PCP:  Peter Aus, MD Pharmacy:   [id number] [place], [address] [place] [address] Phone: [phone] Fax: [phone]     Social Determinants of Health (SDOH) Interventions    Readmission Risk Interventions No flowsheet data found.

## 2020-01-01 LAB — TSH: TSH: 1.415 u[IU]/mL (ref 0.350–4.500)

## 2020-01-01 MED ORDER — HALOPERIDOL LACTATE 5 MG/ML IJ SOLN
INTRAMUSCULAR | Status: AC
  Administered 2020-01-01: 3 mg via INTRAMUSCULAR
  Filled 2020-01-01: qty 1

## 2020-01-01 MED ORDER — LORAZEPAM 2 MG/ML IJ SOLN: 2.0000 mg | Freq: Once | INTRAMUSCULAR | Status: AC

## 2020-01-01 MED ORDER — HALOPERIDOL LACTATE 5 MG/ML IJ SOLN: 3.0000 mg | Freq: Once | INTRAMUSCULAR | Status: AC

## 2020-01-01 MED ORDER — LORAZEPAM 2 MG/ML IJ SOLN
INTRAMUSCULAR | Status: AC
  Administered 2020-01-01: 1 mg via INTRAMUSCULAR
  Filled 2020-01-01: qty 1

## 2020-01-01 NOTE — ED Notes (Signed)
PT CONTINUES TO REST IN ROOM , NO CO OF PAIN AND NO REQUESTS AT THIS TIME

## 2020-01-01 NOTE — ED Provider Notes (Signed)
Emergency Medicine Observation Re-evaluation Note  Peter Howard. is a 74 y.o. male, seen on rounds today.  Pt initially presented to the ED for complaints of Altered Mental Status   Physical Exam  BP 135/87   Pulse 88   Temp 98.8 F (37.1 C) (Oral)   Resp 18   Ht 5\' 8"  (1.727 m)   Wt 65.8 kg   SpO2 100%   BMI 22.05 kg/m  Physical Exam General: Currently patient is sleeping in no distress Lungs: Patient breathing easily in no respiratory distress Psych: Patient calm not agitated  ED Course / MDM  EKG:EKG Interpretation  Date/Time:  Sunday December 30 2019 19:33:07 EDT Ventricular Rate:  79 PR Interval:  152 QRS Duration: 84 QT Interval:  352 QTC Calculation: 403 R Axis:   49 Text Interpretation: Sinus rhythm with marked sinus arrhythmia Nonspecific T wave abnormality Abnormal ECG Confirmed by UNCONFIRMED, DOCTOR ([id number]), editor Mel Almond, Tammy [phone]) on 12/31/2019 7:52:44 AM  Clinical Course as of Dec 31 1528  Sun Dec 30, 2019  0620 Upon further review of the patient's chart, he apparently has a substantial alcohol abuse history.  I have added on an ammonia level.  This may be some mild age-related or vascular dementia on top of acute alcohol intoxication.  I am also adding on a urine drug screen although this seems less likely.   [CF]  [phone] Patient's work-up is generally reassuring with no substantial emergent findings on comprehensive metabolic panel, urinalysis, urine drug screen, CBC, normal lactic acid.  The patient's ethanol level is negative as well and he shows no physical signs or symptoms of withdrawal.  Given that I verified in the history that he has a history of alcohol abuse, I am providing a dose of thiamine 100 mg IV.  I also personally reviewed the CT scan and the results from the radiologist and there is no acute or emergent abnormalities.    [CF]  0947 However I reassessed the patient and he still seems completely altered.  He again confirms that he has no  pain but he cannot give me any history at all of what happened tonight, where he was, where he is, etc.  This may be worsening dementia but I cannot find a specific source of delirium.  The ammonia test hemolyzed so the lab was sitting down so I am to redraw.  At this point I am transferring ED care to Dr. Kerman Passey to reassess and determine at that time if he meets any inpatient criteria or if he will require social work assistance.  Of note, I tried contacting the patient's spouse at the number listed in the computer and the number is apparently out of service and no other contact information is listed.   [CF]    Clinical Course User Index [CF] Hinda Kehr, MD   I have reviewed the labs performed to date as well as medications administered while in observation.    Plan  Patient awaiting social work placement. As he is confused I will obtain a TSH and while undergoing that also B12 and folate since he does have a history of alcoholism.   Nena Polio, MD 01/01/20 [phone]

## 2020-01-01 NOTE — ED Notes (Signed)
Lunch tray provided and set up for Pt.

## 2020-01-01 NOTE — ED Notes (Signed)
Pt set up for supper and continues to sit in chair, blanket given and patient repositioned ,

## 2020-01-01 NOTE — TOC Progression Note (Signed)
Transition of Care (TOC) - Progression Note    Patient Details  Name: Peter Howard. MRN: [id number] Date of Birth: [birth year]  Transition of Care [facility]) CM/SW [place], [place] Phone Number: [phone] 01/01/2020, 11:45 AM  Clinical Narrative:     CSW spoke with patient to update him on SNF placement process.  Patient has not received bed offers any but CSW did receive a call from [facility] in  [facility] requesting some information.  Ebony Hail stated she would reach out to Maynard when she had a reply.  Expected Discharge Plan: Skilled Nursing Facility Barriers to Discharge: SNF Pending bed offer, ED Patient Insisting on an Alternate Living Situation/Facility, ED Unsafe disposition, ED SNF auth, ED Facility/Family Refusing to Allow Patient to Return  Expected Discharge Plan and Services Expected Discharge Plan: [place] In-house Referral: Clinical Social Work   Post Acute Care Choice: [place] arrangements for the past 2 months: Single Family Home                                       Social Determinants of Health (SDOH) Interventions    Readmission Risk Interventions No flowsheet data found.

## 2020-01-01 NOTE — ED Notes (Signed)
Pt gave his wife taking his wallet and cash and car keys , pts wife is named Chief Operating Officer

## 2020-01-01 NOTE — ED Notes (Signed)
PT RESTING IN ROOM ON CHAIR , NO COMPLAINTS AT THIS TIME

## 2020-01-01 NOTE — ED Notes (Signed)
Pt resting in bed. Talkative, no needs at current time.

## 2020-01-01 NOTE — TOC Progression Note (Signed)
Transition of Care (TOC) - Progression Note    Patient Details  Name: Peter Howard. MRN: [id number] Date of Birth: 11-15-1945  Transition of Care [facility]) CM/SW [place], [place] Phone Number: [phone] 01/01/2020, 1:59 PM  Clinical Narrative:     [facility] made bed offer confirmed by Gerald Stabs. CSW updated patient on bed offer status and explained to the patient about insurance auth process.  Patient verbalized understanding.  CSW updated patient's step-daughter Ms. Vester on bed offer.  Expected Discharge Plan: Skilled Nursing Facility Barriers to Discharge: SNF Pending bed offer, ED Patient Insisting on an Alternate Living Situation/Facility, ED Unsafe disposition, ED SNF auth, ED Facility/Family Refusing to Allow Patient to Return  Expected Discharge Plan and Services Expected Discharge Plan: [place] In-house Referral: Clinical Social Work   Post Acute Care Choice: [place] arrangements for the past 2 months: Single Family Home                                       Social Determinants of Health (SDOH) Interventions    Readmission Risk Interventions No flowsheet data found.

## 2020-01-01 NOTE — ED Notes (Signed)
Pt pants soiled with urine. Pants and shirt removed. Pt wiped down with bath wipes. Brief applied. Gown placed on pt. Bed sheet changed. Bed alarm on. Denies further needs at this time.

## 2020-01-01 NOTE — ED Provider Notes (Signed)
-----------------------------------------   5:41 AM on 01/01/2020 -----------------------------------------   Blood pressure (!) 146/110, pulse 64, temperature 98.7 F (37.1 C), temperature source Oral, resp. rate 18, height 5\' 8"  (1.727 m), weight 65.8 kg, SpO2 100 %.  The patient is calm and cooperative at this time.  There have been no acute events since the last update.  Awaiting disposition plan from Social Work team.   Paulette Blanch, MD 01/01/20 [phone]

## 2020-01-01 NOTE — ED Notes (Signed)
PT ATE 80% SUPPER , PT CONTINUES TO REST IN CHAIR AND HAVE NO COMPLAINTS

## 2020-01-01 NOTE — ED Notes (Signed)
Patient is resting comfortably. 

## 2020-01-01 NOTE — ED Notes (Signed)
Pt up to a bedside chair.

## 2020-01-01 NOTE — ED Notes (Signed)
Pt's wife to bedside.  

## 2020-01-01 NOTE — ED Notes (Signed)
Patient denies pain and is resting comfortably.  

## 2020-01-02 LAB — FOLATE RBC
Folate, Hemolysate: 374 ng/mL
Folate, RBC: 829 ng/mL (ref >498)
Hematocrit: 45.1 % (ref 37.5–51.0)

## 2020-01-02 LAB — VITAMIN B12: Vitamin B-12: 404 pg/mL (ref 180–914)

## 2020-01-02 NOTE — ED Notes (Addendum)
Pt resting with eyes closed. Respirations equal and unlabored. Lights dimmed.

## 2020-01-02 NOTE — ED Notes (Signed)
Pt assisted to commode, BM and voided. Assisted back to recliner, no issues.

## 2020-01-02 NOTE — ED Notes (Signed)
Pt resting quietly with eyes closed, respirations equal and unlabored. Room darkened, door open for frequent monitoring.  Will continue to monitor.

## 2020-01-02 NOTE — Progress Notes (Signed)
Physical Therapy Treatment Patient Details Name: Peter Howard. MRN: [id number] DOB: 04-23-1945 Today's Date: 01/02/2020    History of Present Illness Pt is a 74 yo male with PMH that includes DM, HLD, HTN, PVD, CVA, TIA, and tubular adenoma of the colon.  Pt presented to the ED with AMS. Reportedly the patient was found confused and away from home on someone else's property.    PT Comments    Pt was pleasant and motivated to participate during the session.  Pt put forth good effort during the session but continued to require physical assistance with transfers and gait to prevent LOB.  Pt was able to amb 30' this session but continued to do so with very slow, festinating gait pattern.  Pt would take longer steps with cuing for sequencing but would immediately return to festinating pattern when cues stopped.  Pt will benefit from PT services in a SNF setting upon discharge to safely address deficits listed in patient problem list for decreased caregiver assistance and eventual return to PLOF.     Follow Up Recommendations  SNF     Equipment Recommendations  Rolling walker with 5" wheels    Recommendations for Other Services       Precautions / Restrictions Precautions Precautions: Fall Restrictions Weight Bearing Restrictions: No    Mobility  Bed Mobility Overal bed mobility: Modified Independent             General bed mobility comments: Extra time and effort required  Transfers Overall transfer level: Needs assistance Equipment used: Rolling walker (2 wheeled) Transfers: Sit to/from Stand Sit to Stand: Mod assist         General transfer comment: Min A to prevent posterior LOB upon standing with min verbal cues for sequencing  Ambulation/Gait Ambulation/Gait assistance: Min assist Gait Distance (Feet): 30 Feet Assistive device: Rolling walker (2 wheeled) Gait Pattern/deviations: Step-through pattern;Decreased step length - right;Decreased step length -  left;Festinating Gait velocity: decreased   General Gait Details: Pt required min A for stability during ambulation with very short, festinating gait pattern; pt able to increase step length with cuing but quickly reverted back to festinating pattern   Stairs             Wheelchair Mobility    Modified Rankin (Stroke Patients Only)       Balance Overall balance assessment: Needs assistance   Sitting balance-Leahy Scale: Fair     Standing balance support: Bilateral upper extremity supported;During functional activity Standing balance-Leahy Scale: Poor Standing balance comment: Pt required physical assistance to prevent posterior LOB in standing                            Cognition Arousal/Alertness: Awake/alert Behavior During Therapy: WFL for tasks assessed/performed Overall Cognitive Status: No family/caregiver present to determine baseline cognitive functioning                                 General Comments: Pt able to follow 1-step commands consistently      Exercises Total Joint Exercises Ankle Circles/Pumps: Strengthening;Both;10 reps Quad Sets: Strengthening;Both;10 reps Gluteal Sets: Strengthening;Both;10 reps Long Arc Quad: Strengthening;Both;10 reps Knee Flexion: Strengthening;Both;10 reps Marching in Standing: AROM;Strengthening;Both;10 reps;Standing    General Comments        Pertinent Vitals/Pain Pain Assessment: No/denies pain    Home Living  Prior Function            PT Goals (current goals can now be found in the care plan section) Progress towards PT goals: Progressing toward goals    Frequency    Min 2X/week      PT Plan Current plan remains appropriate    Co-evaluation              AM-PAC PT "6 Clicks" Mobility   Outcome Measure  Help needed turning from your back to your side while in a flat bed without using bedrails?: None Help needed moving from lying  on your back to sitting on the side of a flat bed without using bedrails?: None Help needed moving to and from a bed to a chair (including a wheelchair)?: A Little Help needed standing up from a chair using your arms (e.g., wheelchair or bedside chair)?: A Little Help needed to walk in hospital room?: A Little Help needed climbing 3-5 steps with a railing? : A Lot 6 Click Score: 19    End of Session Equipment Utilized During Treatment: Gait belt Activity Tolerance: Patient tolerated treatment well Patient left: in chair;with chair alarm set;with call bell/phone within reach Nurse Communication: Mobility status PT Visit Diagnosis: Unsteadiness on feet (R26.81);Repeated falls (R29.6);Muscle weakness (generalized) (M62.81);Difficulty in walking, not elsewhere classified (R26.2)     Time: 0233-4356 PT Time Calculation (min) (ACUTE ONLY): 24 min  Charges:  $Gait Training: 8-22 mins $Therapeutic Exercise: 8-22 mins                     D. Scott Addison Freimuth PT, DPT 01/02/20, 3:44 PM

## 2020-01-02 NOTE — ED Notes (Signed)
Physical therapy in to eval Pt. Assisted to stand and pt able to ambulate with walker. Pt had soiled his clothing. Pt changed and cleaned. Set up in bedside recliner, lunch provided.

## 2020-01-02 NOTE — ED Notes (Signed)
Pt resting quietly with eyes closed, respirations equal and unlabored, will continue to monitor.

## 2020-01-02 NOTE — ED Notes (Signed)
Pt still sleeping...

## 2020-01-02 NOTE — TOC Progression Note (Signed)
Transition of Care (TOC) - Progression Note    Patient Details  Name: Peter Howard. MRN: [id number] Date of Birth: 31-Aug-1945  Transition of Care [facility]) CM/SW [place], [place] Phone Number: [phone] 01/02/2020, 12:29 PM  Clinical Narrative:      Patient has bed offer at [facility] pending insurance authorization.   Expected Discharge Plan: Skilled Nursing Facility Barriers to Discharge: SNF Pending bed offer, ED Patient Insisting on an Alternate Living Situation/Facility, ED Unsafe disposition, ED SNF auth, ED Facility/Family Refusing to Allow Patient to Return  Expected Discharge Plan and Services Expected Discharge Plan: [place] In-house Referral: Clinical Social Work   Post Acute Care Choice: [place] arrangements for the past 2 months: Single Family Home                                       Social Determinants of Health (SDOH) Interventions    Readmission Risk Interventions No flowsheet data found.

## 2020-01-02 NOTE — ED Notes (Signed)
Pt remains sleeping with eyes closed at this time. Meal tray set on bedside table

## 2020-01-02 NOTE — ED Notes (Signed)
Pt resting, easily awaken, then back to resting.

## 2020-01-02 NOTE — ED Notes (Signed)
Pt resting quietly at this time, respirations equal and unlabored. Will continue to monitor.

## 2020-01-02 NOTE — ED Notes (Signed)
Pt resting quietly on right side, eyes closed, respirations equal and unlabored. Will continue to monitor.

## 2020-01-03 NOTE — ED Notes (Signed)
Patient provided with phone to talk with wife

## 2020-01-03 NOTE — ED Notes (Signed)
Patient walking into hallway, stating "I am waiting on my wife to pick me up", patient redirected back to stretcher. Calm and cooperative.

## 2020-01-03 NOTE — ED Notes (Signed)
Patient laying in stretcher, patients dinner warmed up for him and assisted with opening packages. Patient watching tv and eating.

## 2020-01-03 NOTE — ED Notes (Signed)
Pt eating lunch. No further needs expressed.

## 2020-01-03 NOTE — ED Notes (Signed)
Patient resting on stretcher, provided with warm blankets, lights dimmed, TV remains on at this time. Patient states 'I'm just going to rest here for a while". Calm and cooperative.

## 2020-01-03 NOTE — TOC Progression Note (Signed)
Transition of Care (TOC) - Progression Note    Patient Details  Name: Peter Howard. MRN: [id number] Date of Birth: 08-21-1945  Transition of Care [facility]) CM/SW [place], [place] Phone Number: [phone] 01/03/2020, 3:45 PM  Clinical Narrative:     CSW spoke with patient and updated him on pending insurance auth for [facility] bed placement. Patient verbalized understanding.  Expected Discharge Plan: Skilled Nursing Facility Barriers to Discharge: SNF Pending bed offer, ED Patient Insisting on an Alternate Living Situation/Facility, ED Unsafe disposition, ED SNF auth, ED Facility/Family Refusing to Allow Patient to Return  Expected Discharge Plan and Services Expected Discharge Plan: [place] In-house Referral: Clinical Social Work   Post Acute Care Choice: [place] arrangements for the past 2 months: Single Family Home                                       Social Determinants of Health (SDOH) Interventions    Readmission Risk Interventions No flowsheet data found.

## 2020-01-03 NOTE — ED Provider Notes (Signed)
-----------------------------------------   5:23 AM on 01/03/2020 -----------------------------------------   Blood pressure 137/88, pulse 68, temperature 98 F (36.7 C), temperature source Oral, resp. rate 17, height 5\' 8"  (1.727 m), weight 65.8 kg, SpO2 98 %.  The patient is sleeping at this time.  There have been no acute events since the last update.  Awaiting disposition plan from Social Work team.   Paulette Blanch, MD 01/03/20 [phone]

## 2020-01-03 NOTE — ED Notes (Signed)
Patient ambulating in room, put on shoes and states "I am about to go home, I need to go home or my wife is going to be mad" Patient redirected back to recliner. Patient informed he is going to spend the night in the ER. Patient calm and cooperative. Provided with drink and changed channel to movie per patient request.

## 2020-01-03 NOTE — ED Notes (Signed)
Provided with dinner tray. Sitting in recliner and watching TV.

## 2020-01-03 NOTE — ED Notes (Signed)
Patient provided with drink and ice cream

## 2020-01-04 NOTE — TOC Transition Note (Signed)
Transition of Care [facility]) - CM/SW Discharge Note   Patient Details  Name: Peter Howard. MRN: [id number] Date of Birth: 1945/11/02  Transition of Care [facility]) CM/SW Contact:  Ova Freshwater Phone Number: [phone] 01/04/2020, 10:23 AM   Clinical Narrative:     Patient d/c to [facility] Room 808, Report [phone] ask for Yaakov Guthrie.  EDP/ED Staff updated.  Patient's family notified.  TOC consult complete.   Final next level of care: Long Term Nursing Home Barriers to Discharge: SNF Pending bed offer, ED Patient Insisting on an Alternate Living Situation/Facility, ED Unsafe disposition, ED SNF auth, ED Facility/Family Refusing to Allow Patient to Return   Patient Goals and CMS Choice Patient states their goals for this hospitalization and ongoing recovery are:: Patient stated he is ok with going to a SNF.  [facility] APS involved. CMS Medicare.gov Compare Post Acute Care list provided to:: Patient Choice offered to / list presented to : Patient  Discharge Placement                       Discharge Plan and Services In-house Referral: Clinical Social Work   Post Acute Care Choice: [place]                               Social Determinants of Health (SDOH) Interventions     Readmission Risk Interventions No flowsheet data found.

## 2020-01-04 NOTE — ED Notes (Signed)
Patient is eating his breakfast biscuit and drinking applejuice

## 2020-01-04 NOTE — ED Notes (Signed)
Patient was wiped and cleaned, new depends placed on patient. Cleaned by EDT Faith

## 2020-01-04 NOTE — ED Notes (Signed)
Patient lying in bed and appears to be asleep, NAD observed and will continue to monitor

## 2020-01-04 NOTE — ED Notes (Signed)
Patient discharged to [facility] with [place], patient and EMS received transfer papers. Patient received belongings. Patient appropriate and cooperative. NAD noted.

## 2020-01-04 NOTE — ED Notes (Signed)
Attempted to call report to Yaakov Guthrie, she is doing a discharge and will call me right back.

## 2020-01-04 NOTE — ED Provider Notes (Signed)
Emergency Medicine Observation Re-evaluation Note  Peter Howard. is a 74 y.o. male, seen on rounds today.  Pt initially presented to the ED for complaints of Altered Mental Status Currently, the patient is resting comfortably.  No acute distress.  No acute issues overnight..  Physical Exam  BP 134/77 (BP Location: Right Arm)    Pulse (!) 58    Temp 98.1 F (36.7 C) (Oral)    Resp 17    Ht 5\' 8"  (1.727 m)    Wt 65.8 kg    SpO2 99%    BMI 22.05 kg/m  Physical Exam General: No acute issues overnight.  Currently resting comfortably.  ED Course / MDM   Reviewed recent lab work.  Overall nonrevealing. Plan  Current plan is for placement. Patient is not under full IVC at this time.   Harvest Dark, MD 01/04/20 [phone]

## 2020-01-04 NOTE — ED Notes (Signed)
Report received from Robin RN. Patient care assumed. Patient/RN introduction complete. Will continue to monitor.  

## 2020-01-04 NOTE — ED Notes (Signed)
Patient able to take medications PO with no complaints

## 2020-06-07 ENCOUNTER — Other Ambulatory Visit: Payer: Self-pay

## 2020-06-07 ENCOUNTER — Emergency Department
Admission: EM | Admit: 2020-06-07 | Discharge: 2020-06-07 | Disposition: A | Discharge status: Home / Self Care | Payer: Medicare HMO | Attending: Emergency Medicine | Admitting: Emergency Medicine

## 2020-06-07 DIAGNOSIS — I10 Essential (primary) hypertension: Secondary | ICD-10-CM | POA: Insufficient documentation

## 2020-06-07 DIAGNOSIS — F17210 Nicotine dependence, cigarettes, uncomplicated: Secondary | ICD-10-CM | POA: Diagnosis not present

## 2020-06-07 DIAGNOSIS — E119 Type 2 diabetes mellitus without complications: Secondary | ICD-10-CM | POA: Insufficient documentation

## 2020-06-07 DIAGNOSIS — M79632 Pain in left forearm: Secondary | ICD-10-CM | POA: Insufficient documentation

## 2020-06-07 DIAGNOSIS — L819 Disorder of pigmentation, unspecified: Secondary | ICD-10-CM | POA: Insufficient documentation

## 2020-06-07 NOTE — ED Provider Notes (Signed)
[facility] Emergency Department Provider Note  ____________________________________________   Event Date/Time   First MD Initiated Contact with Patient 06/07/20 1549     (approximate)  I have reviewed the triage vital signs and the nursing notes.   HISTORY  Chief Complaint Hand Problem  HPI Peter Howard. is a 75 y.o. male who presents to the emergency department for evaluation of discoloration of his skin.  He states that approximately 1 week ago, he was helping a man to lift a heavy bag from a tailgate of a truck, carrying it with both arms.  Since that time he has noticed some darker discoloration to his hand/knuckles as well as some lighter discoloration to his forearms.  He denies any pain, itching, rash or other changes.  He denies any history of similar.        Past Medical History:  Diagnosis Date  . BPH (benign prostatic hypertrophy)   . Diabetes mellitus without complication ([place])   . Glaucoma   . Hyperlipidemia   . Hypertension   . Peripheral vascular disease ([place])   . Stroke ([place])   . TIA (transient ischemic attack)   . Tubular adenoma of colon     There are no problems to display for this patient.   Past Surgical History:  Procedure Laterality Date  . COLONOSCOPY    . COLONOSCOPY N/A 03/13/2015   Procedure: COLONOSCOPY;  Surgeon: Hulen Luster, MD;  Location: [facility] ENDOSCOPY;  Service: Gastroenterology;  Laterality: N/A;  DIABETIC  . EYE SURGERY     glaucoma  . FOOT SURGERY    . HERNIA REPAIR    . INSERTION OF MESH Right 11/10/2018   Procedure: INSERTION OF MESH;  Surgeon: Benjamine Sprague, DO;  Location: [facility] ORS;  Service: General;  Laterality: Right;  . ROTATOR CUFF REPAIR    . XI ROBOTIC ASSISTED INGUINAL HERNIA REPAIR WITH MESH Right 11/10/2018   Procedure: XI ROBOTIC ASSISTED RIGHT  INGUINAL HERNIA REPAIR;  Surgeon: Benjamine Sprague, DO;  Location: [facility] ORS;  Service: General;  Laterality: Right;    Prior to Admission medications    Medication Sig Start Date End Date Taking? Authorizing Provider  donepezil (ARICEPT) 10 MG tablet Take 10 mg by mouth at bedtime. Patient not taking: Reported on 01/01/2020    [provider]  mirtazapine (REMERON) 15 MG tablet Take 15 mg by mouth at bedtime. Patient not taking: Reported on 01/01/2020    [provider]  omeprazole (PRILOSEC) 40 MG capsule Take 40 mg by mouth daily.  Patient not taking: Reported on 01/01/2020    [provider]  tamsulosin (FLOMAX) 0.4 MG CAPS capsule Take 0.4 mg by mouth daily.  Patient not taking: Reported on 01/01/2020    [provider]    Allergies Penicillins  No family history on file.  Social History Social History   Tobacco Use  . Smoking status: Current Some Day Smoker    Types: Cigarettes  . Smokeless tobacco: Never Used  Vaping Use  . Vaping Use: Never used  Substance Use Topics  . Alcohol use: Yes  . Drug use: No    Review of Systems Constitutional: No fever/chills Eyes: No visual changes. ENT: No sore throat. Cardiovascular: Denies chest pain. Respiratory: Denies shortness of breath. Gastrointestinal: No abdominal pain.  No nausea, no vomiting.  No diarrhea.  No constipation. Genitourinary: Negative for dysuria. Musculoskeletal: Negative for back pain. Skin: + Discoloration of bilateral hands Neurological: Negative for headaches, focal weakness or numbness.  ____________________________________________   PHYSICAL EXAM:  VITAL SIGNS: ED Triage Vitals [06/07/20 1543]  Enc Vitals Group     BP 138/86     Pulse Rate 81     Resp 16     Temp 98.6 F (37 C)     Temp Source Oral     SpO2 98 %     Weight      Height      Head Circumference      Pe[facility] Flow      Pain Score 0     Pain Loc      Pain Edu?      Excl. in [place]?    Constitutional: Alert and oriented. Well appearing and in no acute distress. Eyes: Conjunctivae are normal. PERRL. EOMI. Head: Atraumatic. Nose: No  congestion/rhinnorhea. Mouth/Throat: Mucous membranes are moist.  Oropharynx non-erythematous. Neck: No stridor.   Cardiovascular: Normal rate, regular rhythm. Grossly normal heart sounds.  Good peripheral circulation. Respiratory: Normal respiratory effort.  No retractions. Lungs CTAB. Gastrointestinal: Soft and nontender. No distention. No abdominal bruits. No CVA tenderness. Musculoskeletal: There is no tenderness, erythema or swelling noted to the bilateral hands or forearms.  Patient has full range of motion of all digits and wrists.  Radial pulse 2+ bilaterally, capillary refill less than 3 seconds all digits. Neurologic:  Normal speech and language. No gross focal neurologic deficits are appreciated. No gait instability. Skin: There is no erythema, swelling or warmth to the skin of the bilateral hands or forearms.  There is mild dry scaling on the lateral half of the proximal forearm.  No other noted skin changes. Psychiatric: Mood and affect are normal. Speech and behavior are normal.   ____________________________________________   INITIAL IMPRESSION / ASSESSMENT AND PLAN / ED COURSE  As part of my medical decision making, I reviewed the following data within the [place] notes reviewed and incorporated and Notes from prior ED visits        Patient is a 75 year old male who presents to the emergency department for evaluation of bilateral hand and forearm discoloration since moving a heavy object 1 week ago.  See HPI for further details.  In triage, patient's vital signs are within normal limits.  On physical exam, patient is neurovascularly intact with full range of motion and function of the bilateral hands and forearms.  He does of note have a dry scaling rash to the lateral aspect of the left forearm, though does not have this on the right or on the hands.  He states that he thinks that he has dry skin from the heavy object rubbing against it.  He denies  any rash in any other location, denies pain, denies any other associated symptoms.  Discussed treatment of dry skin with the patient, including emollient barrier as well as lotion.  Return precautions were discussed, and patient is amenable with plan.      ____________________________________________   FINAL CLINICAL IMPRESSION(S) / ED DIAGNOSES  Final diagnoses:  Left forearm pain     ED Discharge Orders    None      *Please note:  Peter Howard. was evaluated in Emergency Department on 06/07/2020 for the symptoms described in the history of present illness. He was evaluated in the context of the global COVID-19 pandemic, which necessitated consideration that the patient might be at risk for infection with the SARS-CoV-2 virus that causes COVID-19. Institutional protocols and algorithms that pertain to the evaluation of patients at risk  for COVID-19 are in a state of rapid change based on information released by regulatory bodies including the CDC and federal and state organizations. These policies and algorithms were followed during the patient's care in the ED.  Some ED evaluations and interventions may be delayed as a result of limited staffing during and the pandemic.*   Note:  This document was prepared using Dragon voice recognition software and may include unintentional dictation errors.   Marlana Salvage, PA 06/07/20 2138    Nance Pear, MD 06/07/20 [phone]

## 2020-06-07 NOTE — ED Triage Notes (Signed)
Pt presents via POV c/o bilateral hand darkening. Reports bilateral hands are darker than normal complexion. Bilateral radial pulses present. Denies hand pain. Ambulatory to triage using assistance device.

## 2020-06-07 NOTE — Discharge Instructions (Addendum)
Apply lotion to effected area several times per day. Return to ER with any worsening or changes.

## 2020-06-26 ENCOUNTER — Encounter: Payer: Self-pay | Admitting: Emergency Medicine

## 2020-06-26 ENCOUNTER — Observation Stay: Payer: Medicare HMO

## 2020-06-26 ENCOUNTER — Observation Stay: Admit: 2020-06-26 | Payer: Medicare HMO

## 2020-06-26 ENCOUNTER — Other Ambulatory Visit: Payer: Self-pay

## 2020-06-26 ENCOUNTER — Emergency Department: Payer: Medicare HMO

## 2020-06-26 ENCOUNTER — Inpatient Hospital Stay
Admission: EM | Admit: 2020-06-26 | Discharge: 2020-06-28 | DRG: 638 | Disposition: A | Discharge status: Home / Self Care | Payer: Medicare HMO | Attending: Family Medicine | Admitting: Family Medicine

## 2020-06-26 DIAGNOSIS — Z20822 Contact with and (suspected) exposure to covid-19: Secondary | ICD-10-CM | POA: Diagnosis present

## 2020-06-26 DIAGNOSIS — F1020 Alcohol dependence, uncomplicated: Secondary | ICD-10-CM | POA: Diagnosis not present

## 2020-06-26 DIAGNOSIS — R32 Unspecified urinary incontinence: Secondary | ICD-10-CM | POA: Diagnosis present

## 2020-06-26 DIAGNOSIS — R159 Full incontinence of feces: Secondary | ICD-10-CM | POA: Diagnosis not present

## 2020-06-26 DIAGNOSIS — R410 Disorientation, unspecified: Secondary | ICD-10-CM

## 2020-06-26 DIAGNOSIS — N400 Enlarged prostate without lower urinary tract symptoms: Secondary | ICD-10-CM | POA: Diagnosis present

## 2020-06-26 DIAGNOSIS — R4182 Altered mental status, unspecified: Secondary | ICD-10-CM | POA: Diagnosis present

## 2020-06-26 DIAGNOSIS — G9349 Other encephalopathy: Secondary | ICD-10-CM | POA: Diagnosis not present

## 2020-06-26 DIAGNOSIS — E162 Hypoglycemia, unspecified: Secondary | ICD-10-CM | POA: Diagnosis not present

## 2020-06-26 DIAGNOSIS — E785 Hyperlipidemia, unspecified: Secondary | ICD-10-CM | POA: Diagnosis not present

## 2020-06-26 DIAGNOSIS — M50223 Other cervical disc displacement at C6-C7 level: Secondary | ICD-10-CM | POA: Diagnosis not present

## 2020-06-26 DIAGNOSIS — M50221 Other cervical disc displacement at C4-C5 level: Secondary | ICD-10-CM | POA: Diagnosis not present

## 2020-06-26 DIAGNOSIS — Z23 Encounter for immunization: Secondary | ICD-10-CM

## 2020-06-26 DIAGNOSIS — I10 Essential (primary) hypertension: Secondary | ICD-10-CM | POA: Diagnosis present

## 2020-06-26 DIAGNOSIS — R55 Syncope and collapse: Secondary | ICD-10-CM

## 2020-06-26 DIAGNOSIS — R404 Transient alteration of awareness: Secondary | ICD-10-CM | POA: Diagnosis not present

## 2020-06-26 DIAGNOSIS — F17210 Nicotine dependence, cigarettes, uncomplicated: Secondary | ICD-10-CM | POA: Diagnosis not present

## 2020-06-26 DIAGNOSIS — Z043 Encounter for examination and observation following other accident: Secondary | ICD-10-CM | POA: Diagnosis not present

## 2020-06-26 DIAGNOSIS — W19XXXA Unspecified fall, initial encounter: Secondary | ICD-10-CM | POA: Diagnosis not present

## 2020-06-26 DIAGNOSIS — M5021 Other cervical disc displacement,  high cervical region: Secondary | ICD-10-CM | POA: Diagnosis not present

## 2020-06-26 DIAGNOSIS — E1151 Type 2 diabetes mellitus with diabetic peripheral angiopathy without gangrene: Secondary | ICD-10-CM | POA: Diagnosis present

## 2020-06-26 DIAGNOSIS — Z8673 Personal history of transient ischemic attack (TIA), and cerebral infarction without residual deficits: Secondary | ICD-10-CM

## 2020-06-26 DIAGNOSIS — G9340 Encephalopathy, unspecified: Secondary | ICD-10-CM | POA: Diagnosis present

## 2020-06-26 DIAGNOSIS — E11649 Type 2 diabetes mellitus with hypoglycemia without coma: Principal | ICD-10-CM | POA: Diagnosis present

## 2020-06-26 DIAGNOSIS — M47812 Spondylosis without myelopathy or radiculopathy, cervical region: Secondary | ICD-10-CM | POA: Diagnosis not present

## 2020-06-26 HISTORY — DX: Unspecified convulsions: R56.9

## 2020-06-26 LAB — ECHOCARDIOGRAM COMPLETE: Weight: 2208 oz

## 2020-06-26 LAB — URINE DRUG SCREEN, QUALITATIVE (ARMC ONLY)
Amphetamines, Ur Screen: NOT DETECTED
Barbiturates, Ur Screen: NOT DETECTED
Benzodiazepine, Ur Scrn: NOT DETECTED
Cannabinoid 50 Ng, Ur ~~LOC~~: NOT DETECTED
Cocaine Metabolite,Ur ~~LOC~~: NOT DETECTED
MDMA (Ecstasy)Ur Screen: NOT DETECTED
Methadone Scn, Ur: NOT DETECTED
Opiate, Ur Screen: NOT DETECTED
Phencyclidine (PCP) Ur S: NOT DETECTED
Tricyclic, Ur Screen: NOT DETECTED

## 2020-06-26 LAB — COMPREHENSIVE METABOLIC PANEL
ALT: 11 U/L (ref 0–44)
AST: 17 U/L (ref 15–41)
Albumin: 3.7 g/dL (ref 3.5–5.0)
Alkaline Phosphatase: 55 U/L (ref 38–126)
Anion gap: 13 (ref 5–15)
BUN: 13 mg/dL (ref 8–23)
CO2: 18 mmol/L — ABNORMAL LOW (ref 22–32)
Calcium: 8.6 mg/dL — ABNORMAL LOW (ref 8.9–10.3)
Chloride: 108 mmol/L (ref 98–111)
Creatinine, Ser: 0.71 mg/dL (ref 0.61–1.24)
GFR, Estimated: >60 mL/min (ref >60)
Glucose, Bld: 68 mg/dL — ABNORMAL LOW (ref 70–99)
Potassium: 3.8 mmol/L (ref 3.5–5.1)
Sodium: 139 mmol/L (ref 135–145)
Total Bilirubin: 1.3 mg/dL — ABNORMAL HIGH (ref 0.3–1.2)
Total Protein: 7 g/dL (ref 6.5–8.1)

## 2020-06-26 LAB — CBG MONITORING, ED
Glucose-Capillary: 67 mg/dL — ABNORMAL LOW (ref 70–99)
Glucose-Capillary: 96 mg/dL (ref 70–99)
Glucose-Capillary: 81 mg/dL (ref 70–99)
Glucose-Capillary: 63 mg/dL — ABNORMAL LOW (ref 70–99)
Glucose-Capillary: 85 mg/dL (ref 70–99)

## 2020-06-26 LAB — CBC WITH DIFFERENTIAL/PLATELET
Abs Immature Granulocytes: 0.03 10*3/uL (ref 0.00–0.07)
Basophils Absolute: 0 10*3/uL (ref 0.0–0.1)
Basophils Relative: 0 %
Eosinophils Absolute: 0.1 10*3/uL (ref 0.0–0.5)
Eosinophils Relative: 1 %
HCT: 42.9 % (ref 39.0–52.0)
Hemoglobin: 14 g/dL (ref 13.0–17.0)
Immature Granulocytes: 0 %
Lymphocytes Relative: 13 %
Lymphs Abs: 1.3 10*3/uL (ref 0.7–4.0)
MCH: 31.3 pg (ref 26.0–34.0)
MCHC: 32.6 g/dL (ref 30.0–36.0)
MCV: 95.8 fL (ref 80.0–100.0)
Monocytes Absolute: 0.7 10*3/uL (ref 0.1–1.0)
Monocytes Relative: 7 %
Neutro Abs: 7.6 10*3/uL (ref 1.7–7.7)
Neutrophils Relative %: 79 %
Platelets: 158 10*3/uL (ref 150–400)
RBC: 4.48 MIL/uL (ref 4.22–5.81)
RDW: 14.3 % (ref 11.5–15.5)
WBC: 9.7 10*3/uL (ref 4.0–10.5)
nRBC: 0 % (ref 0.0–0.2)

## 2020-06-26 LAB — BLOOD GAS, VENOUS
Acid-Base Excess: 2.8 mmol/L — ABNORMAL HIGH (ref 0.0–2.0)
Bicarbonate: 28.5 mmol/L — ABNORMAL HIGH (ref 20.0–28.0)
O2 Saturation: 69.9 %
Patient temperature: 37
pCO2, Ven: 47 mmHg (ref 44.0–60.0)
pH, Ven: 7.39 (ref 7.250–7.430)
pO2, Ven: 37 mmHg (ref 32.0–45.0)

## 2020-06-26 LAB — URINALYSIS, COMPLETE (UACMP) WITH MICROSCOPIC
Bacteria, UA: NONE SEEN
Bilirubin Urine: NEGATIVE
Glucose, UA: NEGATIVE mg/dL
Hgb urine dipstick: NEGATIVE
Ketones, ur: 20 mg/dL — AB
Leukocytes,Ua: NEGATIVE
Nitrite: NEGATIVE
Protein, ur: NEGATIVE mg/dL
Specific Gravity, Urine: 1.019 (ref 1.005–1.030)
Squamous Epithelial / HPF: NONE SEEN (ref 0–5)
WBC, UA: NONE SEEN WBC/hpf (ref 0–5)
pH: 6 (ref 5.0–8.0)

## 2020-06-26 LAB — MAGNESIUM: Magnesium: 1.6 mg/dL — ABNORMAL LOW (ref 1.7–2.4)

## 2020-06-26 LAB — TROPONIN I (HIGH SENSITIVITY)
Troponin I (High Sensitivity): 20 ng/L — ABNORMAL HIGH (ref <18)
Troponin I (High Sensitivity): 24 ng/L — ABNORMAL HIGH (ref <18)

## 2020-06-26 LAB — RESP PANEL BY RT-PCR (FLU A&B, COVID) ARPGX2
Influenza A by PCR: NEGATIVE
Influenza B by PCR: NEGATIVE
SARS Coronavirus 2 by RT PCR: NEGATIVE

## 2020-06-26 LAB — AMMONIA: Ammonia: 12 umol/L (ref 9–35)

## 2020-06-26 LAB — CK: Total CK: 127 U/L (ref 49–397)

## 2020-06-26 LAB — GLUCOSE, CAPILLARY: Glucose-Capillary: 77 mg/dL (ref 70–99)

## 2020-06-26 LAB — PHOSPHORUS: Phosphorus: 3.3 mg/dL (ref 2.5–4.6)

## 2020-06-26 LAB — ETHANOL: Alcohol, Ethyl (B): <10 mg/dL (ref <10)

## 2020-06-26 MED ORDER — LORAZEPAM 2 MG/ML IJ SOLN
1.0000 mg | INTRAMUSCULAR | Status: DC | PRN
  Administered 2020-06-26: 1 mg via INTRAVENOUS
  Administered 2020-06-26 – 2020-06-28 (×7): 2 mg via INTRAVENOUS
  Filled 2020-06-26 (×8): qty 1

## 2020-06-26 MED ORDER — LORAZEPAM 1 MG PO TABS: 1.0000 mg | ORAL_TABLET | ORAL | Status: DC | PRN

## 2020-06-26 MED ORDER — SODIUM CHLORIDE 0.9 % IV BOLUS
500.0000 mL | Freq: Once | INTRAVENOUS | Status: AC
  Administered 2020-06-26: 500 mL via INTRAVENOUS

## 2020-06-26 MED ORDER — TETANUS-DIPHTH-ACELL PERTUSSIS 5-2.5-18.5 LF-MCG/0.5 IM SUSY
0.5000 mL | PREFILLED_SYRINGE | Freq: Once | INTRAMUSCULAR | Status: AC
  Administered 2020-06-26: 0.5 mL via INTRAMUSCULAR
  Filled 2020-06-26: qty 0.5

## 2020-06-26 MED ORDER — ACETAMINOPHEN 325 MG PO TABS
650.0000 mg | ORAL_TABLET | Freq: Four times a day (QID) | ORAL | Status: DC | PRN
Start: 2020-06-26 — End: 2020-06-29

## 2020-06-26 MED ORDER — THIAMINE HCL 100 MG/ML IJ SOLN: 100.0000 mg | Freq: Once | INTRAMUSCULAR | Status: DC

## 2020-06-26 MED ORDER — MAGNESIUM SULFATE 2 GM/50ML IV SOLN
2.0000 g | Freq: Once | INTRAVENOUS | Status: AC
  Administered 2020-06-26: 2 g via INTRAVENOUS
  Filled 2020-06-26: qty 50

## 2020-06-26 MED ORDER — ONDANSETRON HCL 4 MG/2ML IJ SOLN: 4.0000 mg | Freq: Four times a day (QID) | INTRAMUSCULAR | Status: DC | PRN

## 2020-06-26 MED ORDER — THIAMINE HCL 100 MG/ML IJ SOLN
Freq: Once | INTRAVENOUS | Status: DC
  Filled 2020-06-26: qty 1000

## 2020-06-26 MED ORDER — ENOXAPARIN SODIUM 40 MG/0.4ML IJ SOSY
40.0000 mg | PREFILLED_SYRINGE | INTRAMUSCULAR | Status: DC
  Administered 2020-06-26 – 2020-06-27 (×2): 40 mg via SUBCUTANEOUS
  Filled 2020-06-26 (×2): qty 0.4

## 2020-06-26 MED ORDER — ENOXAPARIN SODIUM 40 MG/0.4ML IJ SOSY: 40.0000 mg | PREFILLED_SYRINGE | INTRAMUSCULAR | Status: DC

## 2020-06-26 MED ORDER — THIAMINE HCL 100 MG/ML IJ SOLN
100.0000 mg | Freq: Once | INTRAMUSCULAR | Status: AC
  Administered 2020-06-26: 100 mg via INTRAVENOUS
  Filled 2020-06-26: qty 2

## 2020-06-26 MED ORDER — SODIUM CHLORIDE 0.9% FLUSH
3.0000 mL | Freq: Two times a day (BID) | INTRAVENOUS | Status: DC
  Administered 2020-06-26 – 2020-06-28 (×4): 3 mL via INTRAVENOUS

## 2020-06-26 MED ORDER — ONDANSETRON HCL 4 MG PO TABS: 4.0000 mg | ORAL_TABLET | Freq: Four times a day (QID) | ORAL | Status: DC | PRN

## 2020-06-26 MED ORDER — ACETAMINOPHEN 650 MG RE SUPP: 650.0000 mg | Freq: Four times a day (QID) | RECTAL | Status: DC | PRN

## 2020-06-26 MED ORDER — THIAMINE HCL 100 MG/ML IJ SOLN
Freq: Once | INTRAVENOUS | Status: AC
  Filled 2020-06-26: qty 1000

## 2020-06-26 NOTE — ED Notes (Signed)
Pt transported to CT at this time.

## 2020-06-26 NOTE — ED Notes (Signed)
Unable to pull bloodwork at this time from IVs. Called lab at this time to collect bloodwork.

## 2020-06-26 NOTE — ED Notes (Signed)
Lab at bedside

## 2020-06-26 NOTE — ED Notes (Signed)
Pt back from MRI, UA sent, awaiting results. Pt ao4. Breathing regular and unlabored. Denies needs or concerns. Lights dimmed for pt comfort, door remains open for easy visualization

## 2020-06-26 NOTE — ED Notes (Signed)
Pt resting in room, not attempting to get out of bed. Awakens to RN entering room, denies needs or concerns at this time. Pt placed on external catheter at 1200 for urine catch, pt has not voided since placement. Awaiting MRI

## 2020-06-26 NOTE — ED Notes (Signed)
Pt found covered in feces and urine. Pt fully changed. New brief and pad placed on pt at this time.

## 2020-06-26 NOTE — ED Notes (Signed)
Pt requiring redirection multiple times while RN in room despite placement on a bed alarm at 1600. Pt has attempted to get up multiple times and has continued to remove IVs.

## 2020-06-26 NOTE — ED Notes (Signed)
Pt placed on bed alarm after second attempt of getting out of bed. Pt has removed both of his IVs.

## 2020-06-26 NOTE — ED Notes (Signed)
Dr. Jari Pigg, MD at bedside upon arrival. Pt did not sign MSE at this time.

## 2020-06-26 NOTE — ED Provider Notes (Signed)
[facility] Emergency Department Provider Note  ____________________________________________   Event Date/Time   First MD Initiated Contact with Patient 06/26/20 [phone]     (approximate)  I have reviewed the triage vital signs and the nursing notes.   HISTORY  Chief Complaint fall  HPI Peter Howard is a 75 y.o. male who comes in from home for a fall.  Patient was at his apartment complex.  Somebody who lives at his apartment recognized him and states that he sits outside every morning.  However this morning they found him down outside on the ground.  Patient does have an abrasion on his left cheek.  Patient reports that he blacked out and fell onto the ground.  He denies any chest pain or shortness of breath.  Unclear how long he was down there for.  Patient is alert and oriented x1 only.  Denies any chest pain, shortness of breath, abdominal pain.  Denies any alcohol abuse but after his chart was merged it appears that patient does have a history of alcohol abuse.  Review of records patient was placed in a rehab facility back in December patient was most currently seen in the emergency room in early April without any notice of altered mental status.   Medical: Alcohol abuse  There are no problems to display for this patient.    Allergies Patient has no allergy information on record.  No family history on file.  Social History Possible alcohol use but patient is denying today.  Denies drug use.   Review of Systems history is somewhat limited due to patient's mental status change but from what we can gather is that below Constitutional: No fever/chills, positive fall Eyes: No visual changes. ENT: No sore throat. Cardiovascular: Denies chest pain.  Reports blacking out Respiratory: Denies shortness of breath. Gastrointestinal: No abdominal pain.  No nausea, no vomiting.  No diarrhea.  No constipation. Genitourinary: Negative for  dysuria. Musculoskeletal: Negative for back pain. Skin: Negative for rash. Neurological: Negative for headaches, focal weakness or numbness. All other ROS negative ____________________________________________   PHYSICAL EXAM:  VITAL SIGNS: ED Triage Vitals  Blood pressure (!) 143/100, pulse 83, temperature 97.7 F (36.5 C), temperature source Oral, resp. rate 17, SpO2 100 %.  Constitutional: Alert and oriented x1.  Disheveled elderly male Eyes: Conjunctivae are normal. EOMI. Head:   Small abrasion on the left side of his cheek. Nose: No congestion/rhinnorhea. Mouth/Throat: Mucous membranes are moist.   Neck: No stridor. Trachea Midline. FROM Cardiovascular: Normal rate, regular rhythm. Grossly normal heart sounds.  Good peripheral circulation but he does feel cold to touch Respiratory: Normal respiratory effort.  No retractions. Lungs CTAB. Gastrointestinal: Soft and nontender. No distention. No abdominal bruits.  Musculoskeletal: No lower extremity tenderness nor edema.  No joint effusions.  Able to lift both legs up off the bed.  No tenderness on his extremities to suggest fracture Neurologic:  Normal speech and language. No gross focal neurologic deficits are appreciated.  Equal strength in his arms and legs. Skin:  Skin is warm, dry and intact. No rash noted. Psychiatric: Mood and affect are normal. Speech and behavior are normal. GU: Deferred   ____________________________________________   LABS (all labs ordered are listed, but only abnormal results are displayed)  Labs Reviewed  RESP PANEL BY RT-PCR (FLU A&B, COVID) ARPGX2  CBC WITH DIFFERENTIAL/PLATELET  COMPREHENSIVE METABOLIC PANEL  CK  URINALYSIS, COMPLETE (UACMP) WITH MICROSCOPIC  URINE DRUG SCREEN, QUALITATIVE ([facility] ONLY)  AMMONIA  CBG  MONITORING, ED  TROPONIN I (HIGH SENSITIVITY)   ____________________________________________   ED ECG REPORT I, Vanessa [place], the attending physician, personally viewed  and interpreted this ECG.  Sinus rate of 84, no ST elevation, no T wave inversions, normal intervals.  Some artifact due to patient shaking ____________________________________________  RADIOLOGY Robert Bellow, personally viewed and evaluated these images (plain radiographs) as part of my medical decision making, as well as reviewing the written report by the radiologist.  ED MD interpretation:  No pna   Official radiology report(s): DG Chest 2 View  Result Date: 06/26/2020 CLINICAL DATA:  Fall. EXAM: CHEST - 2 VIEW COMPARISON:  February 24, 2019. FINDINGS: The heart size and mediastinal contours are within normal limits. Both lungs are clear. The visualized skeletal structures are unremarkable. IMPRESSION: No active cardiopulmonary disease. Electronically Signed   By: Marijo Conception M.D.   On: 06/26/2020 08:15   CT Head Wo Contrast  Result Date: 06/26/2020 CLINICAL DATA:  Fall.  Found down. EXAM: CT HEAD WITHOUT CONTRAST CT MAXILLOFACIAL WITHOUT CONTRAST TECHNIQUE: Multidetector CT imaging of the head and maxillofacial structures were performed using the standard protocol without intravenous contrast. Multiplanar CT image reconstructions of the maxillofacial structures were also generated. COMPARISON:  CT head December 30, 2019. CT cervical spine September 11, 2017. FINDINGS: CT HEAD FINDINGS Brain: No evidence of acute infarction, hemorrhage, hydrocephalus, extra-axial collection or mass lesion/mass effect. Similar patchy hypodensities within the white matter, likely related to chronic microvascular ischemic disease. Similar generalized cerebral atrophy with ex vacuo ventricular dilation. Vascular: Calcific atherosclerosis. No hyperdense vessel identified. Skull: No acute fracture. Other: No mastoid effusions. CT MAXILLOFACIAL FINDINGS Osseous: No fracture or mandibular dislocation. No destructive process. Dental disease with multiple missing teeth, periapical lucencies and dental caries. Orbits:  Negative. No traumatic or inflammatory finding. Sinuses: Near complete opacification of the left maxillary sinus with areas of internal hyperdensity. Bony wall thickening of the surrounding maxillary sinus wall suggests this is a chronic process. Wind left maxillary ostium and medial bowing and thinning of the medial maxillary sinus wall with narrowed left medial meatus. Mucosal thickening of the left anterior ethmoid air cells. Soft tissues: Negative. CT CERVICAL FINDINGS Alignment: Similar to prior. Straightening of the normal cervical lordosis with slight anterolisthesis of C4 on C5. Skull base and vertebra: No acute fracture. Similar corticated fragment along the right occipital condyle, likely degenerative or secondary to remote trauma. Soft tissues and canal: No prevertebral fluid or swelling. No visible canal hematoma. Disc levels: Similar moderate degenerative disease at C5-C6 with disc height loss and endplate sclerosis. Multilevel facet/uncovertebral hypertrophy, greatest on the left at C4-C5 where there is foraminal narrowing. Upper chest: Clear visualized lung apices. IMPRESSION: CT head: No evidence of acute intracranial abnormality. CT Maxillofacial: 1. No acute fracture. 2. 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 Areas of internal hyperdensity may represent inspissated secretions and/or fungal colonization. There is widening of the left maxillary ostium and medial bowing and thinning or dehiscence of the medial maxillary sinus wall, suggesting possible underlying obstructing mass (most likely a polyp). Recommend correlation with direct inspection. CT cervical spine: 1. No evidence of acute fracture or traumatic malalignment. 2. Moderate degenerative disc disease at C5-C6. Focal left facet arthropathy at C4-C5 where there is foraminal stenosis. MRI could further characterize if clinically indicated. Electronically Signed   By:  Margaretha Sheffield MD   On: 06/26/2020 08:40   CT Cervical Spine Wo Contrast  Result Date: 06/26/2020 CLINICAL DATA:  Fall.  Found down. EXAM: CT HEAD WITHOUT CONTRAST CT MAXILLOFACIAL WITHOUT CONTRAST TECHNIQUE: Multidetector CT imaging of the head and maxillofacial structures were performed using the standard protocol without intravenous contrast. Multiplanar CT image reconstructions of the maxillofacial structures were also generated. COMPARISON:  CT head December 30, 2019. CT cervical spine September 11, 2017. FINDINGS: CT HEAD FINDINGS Brain: No evidence of acute infarction, hemorrhage, hydrocephalus, extra-axial collection or mass lesion/mass effect. Similar patchy hypodensities within the white matter, likely related to chronic microvascular ischemic disease. Similar generalized cerebral atrophy with ex vacuo ventricular dilation. Vascular: Calcific atherosclerosis. No hyperdense vessel identified. Skull: No acute fracture. Other: No mastoid effusions. CT MAXILLOFACIAL FINDINGS Osseous: No fracture or mandibular dislocation. No destructive process. Dental disease with multiple missing teeth, periapical lucencies and dental caries. Orbits: Negative. No traumatic or inflammatory finding. Sinuses: Near complete opacification of the left maxillary sinus with areas of internal hyperdensity. Bony wall thickening of the surrounding maxillary sinus wall suggests this is a chronic process. Wind left maxillary ostium and medial bowing and thinning of the medial maxillary sinus wall with narrowed left medial meatus. Mucosal thickening of the left anterior ethmoid air cells. Soft tissues: Negative. CT CERVICAL FINDINGS Alignment: Similar to prior. Straightening of the normal cervical lordosis with slight anterolisthesis of C4 on C5. Skull base and vertebra: No acute fracture. Similar corticated fragment along the right occipital condyle, likely degenerative or secondary to remote trauma. Soft tissues and canal: No  prevertebral fluid or swelling. No visible canal hematoma. Disc levels: Similar moderate degenerative disease at C5-C6 with disc height loss and endplate sclerosis. Multilevel facet/uncovertebral hypertrophy, greatest on the left at C4-C5 where there is foraminal narrowing. Upper chest: Clear visualized lung apices. IMPRESSION: CT head: No evidence of acute intracranial abnormality. CT Maxillofacial: 1. No acute fracture. 2. 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 Areas of internal hyperdensity may represent inspissated secretions and/or fungal colonization. There is widening of the left maxillary ostium and medial bowing and thinning or dehiscence of the medial maxillary sinus wall, suggesting possible underlying obstructing mass (most likely a polyp). Recommend correlation with direct inspection. CT cervical spine: 1. No evidence of acute fracture or traumatic malalignment. 2. Moderate degenerative disc disease at C5-C6. Focal left facet arthropathy at C4-C5 where there is foraminal stenosis. MRI could further characterize if clinically indicated. Electronically Signed   By: Margaretha Sheffield MD   On: 06/26/2020 08:40   CT Maxillofacial Wo Contrast  Result Date: 06/26/2020 CLINICAL DATA:  Fall.  Found down. EXAM: CT HEAD WITHOUT CONTRAST CT MAXILLOFACIAL WITHOUT CONTRAST TECHNIQUE: Multidetector CT imaging of the head and maxillofacial structures were performed using the standard protocol without intravenous contrast. Multiplanar CT image reconstructions of the maxillofacial structures were also generated. COMPARISON:  CT head December 30, 2019. CT cervical spine September 11, 2017. FINDINGS: CT HEAD FINDINGS Brain: No evidence of acute infarction, hemorrhage, hydrocephalus, extra-axial collection or mass lesion/mass effect. Similar patchy hypodensities within the white matter, likely related to chronic microvascular ischemic disease. Similar  generalized cerebral atrophy with ex vacuo ventricular dilation. Vascular: Calcific atherosclerosis. No hyperdense vessel identified. Skull: No acute fracture. Other: No mastoid effusions. CT MAXILLOFACIAL FINDINGS Osseous: No fracture or mandibular dislocation. No destructive process. Dental disease with multiple missing teeth, periapical lucencies and dental caries. Orbits: Negative. No traumatic or inflammatory finding. Sinuses: Near complete opacification of the left maxillary sinus with areas of internal hyperdensity. Bony wall  thickening of the surrounding maxillary sinus wall suggests this is a chronic process. Wind left maxillary ostium and medial bowing and thinning of the medial maxillary sinus wall with narrowed left medial meatus. Mucosal thickening of the left anterior ethmoid air cells. Soft tissues: Negative. CT CERVICAL FINDINGS Alignment: Similar to prior. Straightening of the normal cervical lordosis with slight anterolisthesis of C4 on C5. Skull base and vertebra: No acute fracture. Similar corticated fragment along the right occipital condyle, likely degenerative or secondary to remote trauma. Soft tissues and canal: No prevertebral fluid or swelling. No visible canal hematoma. Disc levels: Similar moderate degenerative disease at C5-C6 with disc height loss and endplate sclerosis. Multilevel facet/uncovertebral hypertrophy, greatest on the left at C4-C5 where there is foraminal narrowing. Upper chest: Clear visualized lung apices. IMPRESSION: CT head: No evidence of acute intracranial abnormality. CT Maxillofacial: 1. No acute fracture. 2. 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 Areas of internal hyperdensity may represent inspissated secretions and/or fungal colonization. There is widening of the left maxillary ostium and medial bowing and thinning or dehiscence of the medial maxillary sinus wall, suggesting possible  underlying obstructing mass (most likely a polyp). Recommend correlation with direct inspection. CT cervical spine: 1. No evidence of acute fracture or traumatic malalignment. 2. Moderate degenerative disc disease at C5-C6. Focal left facet arthropathy at C4-C5 where there is foraminal stenosis. MRI could further characterize if clinically indicated. Electronically Signed   By: Margaretha Sheffield MD   On: 06/26/2020 08:40    ____________________________________________   PROCEDURES  Procedure(s) performed (including Critical Care):  .1-3 Lead EKG Interpretation Performed by: Vanessa [place]uck[place], MD Authorized by: Vanessa [place], MD     Interpretation: normal     ECG rate:  80s   ECG rate assessment: normal     Rhythm: sinus rhythm     Ectopy: none     Conduction: normal       ____________________________________________   INITIAL IMPRESSION / ASSESSMENT AND PLAN / ED COURSE  Peter Howard was evaluated in Emergency Department on 06/26/2020 for the symptoms described in the history of present illness. He was evaluated in the context of the global COVID-19 pandemic, which necessitated consideration that the patient might be at risk for infection with the SARS-CoV-2 virus that causes COVID-19. Institutional protocols and algorithms that pertain to the evaluation of patients at risk for COVID-19 are in a state of rapid change based on information released by regulatory bodies including the CDC and federal and state organizations. These policies and algorithms were followed during the patient's care in the ED.     Patient comes to the emergency room with possible syncope versus seizure with positive LOC per patient.  We will keep patient on the cardiac monitor and get EKG to evaluate for arrhythmia.  Will get labs to evaluate for Electra abnormalities, AKI.  Will get EtOH, ammonia testing due to concern for possible alcohol abuse although denies use today. Will get CT head and CT cervical to  evaluate for intracranial hemorrhage and cervical fracture.  Patient's abrasion on his left side of his cheek does not appear to need sutures.  Is superficial in nature but will get CT face to make sure no facial fractures.   7:38 AM sugar was noted to be 67 therefore patient was given some juice and will recheck it afterwards  Patient's repeat sugar is normal at 96.  Labs are reassuring except for slightly elevated  T bili  CT imaging shows sinus issues no obvious polyp examination.  Patient troponin is elevated.  CT also shows facet arthropathy at C4-C5.  Patient does have point tenderness on examination so we will get MRI.  Given concern for some altered mental status will also get MRI brain.    On repeat evaluation patient is alert and oriented x2 now.  Is unclear what patient's baseline is.  I did attempt to call patient's wife who did not pick up and mailbox was full.  Given the concern for possible syncopal episode with elevated troponin I will discussed the hospital team for admission for further work-up and management.      ____________________________________________   FINAL CLINICAL IMPRESSION(S) / ED DIAGNOSES   Final diagnoses:  Fall, initial encounter  Syncope, unspecified syncope type      MEDICATIONS GIVEN DURING THIS VISIT:  Medications  Tdap (BOOSTRIX) injection 0.5 mL (0.5 mLs Intramuscular Given 06/26/20 0747)  thiamine (B-1) injection 100 mg (100 mg Intravenous Given 06/26/20 0740)  sodium chloride 0.9 % bolus 500 mL (500 mLs Intravenous New Bag/Given 06/26/20 0736)     ED Discharge Orders    None       Note:  This document was prepared using Dragon voice recognition software and may include unintentional dictation errors.   Vanessa [place], MD 06/26/20 [phone]

## 2020-06-26 NOTE — ED Notes (Signed)
POC CBG reads 67. Dr Jari Pigg, MD made aware. Verbal orders to give OJ at this time and recheck CBG.

## 2020-06-26 NOTE — ED Notes (Addendum)
Pt escorted to floor and returned to ED as inpatient bed was not ready. Pt removed IV upon transport. IV access reobtained. Pt states he is taking out his IV because "im angry." Pt now placed in front of RN desk, requires repeated redirection to stay in bed. Attempted to remove replaced IV. Medicated per MAR.

## 2020-06-26 NOTE — ED Notes (Signed)
Pt hypoglycemic, provided with 3 cups of orange juice which was tolerated well and placed back on infusion p new IV access.

## 2020-06-26 NOTE — ED Notes (Signed)
Assumed care of pt at 1100, pt had loose BM and urinated in brief, pt cleaned and changed. BG 81mg /dL on assessment. Medicated per MAR. Pt intermittently sleeping. Oriented x4. Denies pain at this time. Non skid socks and yellow fall risk bandplaced on patient, side rails up x2 for safety. Awaiting ready inpatient bed

## 2020-06-26 NOTE — H&P (Addendum)
History and Physical    Peter Howard. [id number] DOB: [birth year] DOA: 06/26/2020  PCP: Rusty Aus, MD   Patient coming from: Home  I have personally briefly reviewed patient's old medical records in [place]  Chief Complaint: Fall Most of the history is obtained from the ER notes.  Attempted to reach patient's wife over the phone, unsuccessful HPI: Peter Howard. is a 75 y.o. male with medical history significant for alcohol dependence, ??  Seizure disorder to the ER via EMS for evaluation after he was found on the ground outside his apartment by his neighbor at about 7 AM. Per EMS, patient's neighbor states that the patient usually sits outside every morning, but this morning he found him on the ground.  Patient was noted to be confused and oriented only to self.  He had urinary and fecal incontinence. Patient is unable to provide any history but tells me that he fell.  He denied hitting his head.  During my evaluation he is only oriented to self not to place or time. I am unable to do a review of systems on this patient due to his mental status. Noted to have hypoglycemia with blood sugar of 67 g/dl.  Patient received IV juice in the emergency room with improvement in his blood glucose. Labs show sodium 139, potassium 3.8, chloride 108, bicarb 18, glucose 68, BUN 13, creatinine 0.71, calcium 8.6, alkaline phosphatase 55, albumin 3.7, AST 17, ALT 11, total protein 7.0, ammonia 12, total bilirubin 1.3, white count 9.7, hemoglobin 14.0, hematocrit 42.9, MCV 95.8, RDW 14.3, platelet count 158 Respiratory viral panel is negative CT scan of the head showed no evidence of acute intracranial abnormality. CT maxillofacial shows no acute fracture.  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  Areas of internal hyperdensity noted present inspissated secretions and or fungal colonization.  There is widening of the  left maxillary ostium and medial borderline thinning or dehiscence of the medial maxillary sinus wall, suggesting possible underlying obstructing mass (most likely a polyp).  Recommend correlation with direct inspection. Cervical spine CT shows no evidence of acute fracture or traumatic malalignment.  Moderate degenerative disc disease at C5-C6.  Focal left facet arthropathy at C4-C5 where there is foraminal stenosis. Chest x-ray reviewed by me shows no acute findings Twelve-lead EKG reviewed by me shows sinus rhythm with a lot of artifact.    ED Course: Patient is a 75 year old African-American male with a history of alcohol dependence and ??  Seizure disorder who was brought to ER by EMS for evaluation after he was found on the ground by his neighbor with urinary and fecal incontinence.  Patient is oriented only to self and it is unclear what his baseline is.  He received a dose of thiamine 100 mg IV as well as Tdap in the emergency room.  He will be referred to observation status for further evaluation.   Review of Systems: As per HPI otherwise all other systems reviewed and negative.    Past Medical History:  Diagnosis Date  . Seizures ([place])     History reviewed. No pertinent surgical history.   reports that he has been smoking cigarettes. He has been smoking about 2.00 packs per day. He has never used smokeless tobacco. No history on file for alcohol use and drug use.  Not on File  History reviewed. No pertinent family history.    Prior to Admission medications   Not on  File    Physical Exam: Vitals:   06/26/20 0730 06/26/20 0823 06/26/20 0900 06/26/20 0930  BP: 123/85 (!) 148/76 112/64 120/73  Pulse: 79 87 73 74  Resp: (!) 23 (!) 21 16 15   Temp:      TempSrc:      SpO2: 100% 99% 100% 98%  Weight:      Height:         Vitals:   06/26/20 0730 06/26/20 0823 06/26/20 0900 06/26/20 0930  BP: 123/85 (!) 148/76 112/64 120/73  Pulse: 79 87 73 74  Resp: (!) 23 (!) 21 16  15   Temp:      TempSrc:      SpO2: 100% 99% 100% 98%  Weight:      Height:          Constitutional: Alert and oriented x 1 .  Only to self.  Not in any apparent distress HEENT:      Head: Normocephalic and atraumatic.         Eyes: PERLA, EOMI, Conjunctivae are normal. Sclera is non-icteric.       Mouth/Throat: Mucous membranes are moist.       Neck: Supple with no signs of meningismus. Cardiovascular: Regular rate and rhythm. No murmurs, gallops, or rubs. 2+ symmetrical distal pulses are present . No JVD. No LE edema Respiratory: Respiratory effort normal .Lungs sounds clear bilaterally. No wheezes, crackles, or rhonchi.  Gastrointestinal: Soft, non tender, and non distended with positive bowel sounds.  Genitourinary: No CVA tenderness. Musculoskeletal: Nontender with normal range of motion in all extremities. No cyanosis, or erythema of extremities. Neurologic:  Face is symmetric. Moving all extremities. No gross focal neurologic deficits  Skin: Skin is warm, dry.  No rash or ulcers Psychiatric: Mood and affect are normal   Labs on Admission: I have personally reviewed following labs and imaging studies  CBC: Recent Labs  Lab 06/26/20 0725  WBC 9.7  NEUTROABS 7.6  HGB 14.0  HCT 42.9  MCV 95.8  PLT 387   Basic Metabolic Panel: Recent Labs  Lab 06/26/20 0810  NA 139  K 3.8  CL 108  CO2 18*  GLUCOSE 68*  BUN 13  CREATININE 0.71  CALCIUM 8.6*   GFR: Estimated Creatinine Clearance: 71.7 mL/min (by C-G formula based on SCr of 0.71 mg/dL). Liver Function Tests: Recent Labs  Lab 06/26/20 0810  AST 17  ALT 11  ALKPHOS 55  BILITOT 1.3*  PROT 7.0  ALBUMIN 3.7   No results for input(s): LIPASE, AMYLASE in the last 168 hours. Recent Labs  Lab 06/26/20 0810  AMMONIA 12   Coagulation Profile: No results for input(s): INR, PROTIME in the last 168 hours. Cardiac Enzymes: Recent Labs  Lab 06/26/20 0810  CKTOTAL 127   BNP (last 3 results) No results for  input(s): PROBNP in the last 8760 hours. HbA1C: No results for input(s): HGBA1C in the last 72 hours. CBG: Recent Labs  Lab 06/26/20 0734 06/26/20 0841  GLUCAP 67* 96   Lipid Profile: No results for input(s): CHOL, HDL, LDLCALC, TRIG, CHOLHDL, LDLDIRECT in the last 72 hours. Thyroid Function Tests: No results for input(s): TSH, T4TOTAL, FREET4, T3FREE, THYROIDAB in the last 72 hours. Anemia Panel: No results for input(s): VITAMINB12, FOLATE, FERRITIN, TIBC, IRON, RETICCTPCT in the last 72 hours. Urine analysis: No results found for: COLORURINE, APPEARANCEUR, LABSPEC, [place], GLUCOSEU, HGBUR, BILIRUBINUR, KETONESUR, PROTEINUR, UROBILINOGEN, NITRITE, LEUKOCYTESUR  Radiological Exams on Admission: DG Chest 2 View  Result Date: 06/26/2020 CLINICAL DATA:  Fall. EXAM: CHEST - 2 VIEW COMPARISON:  February 24, 2019. FINDINGS: The heart size and mediastinal contours are within normal limits. Both lungs are clear. The visualized skeletal structures are unremarkable. IMPRESSION: No active cardiopulmonary disease. Electronically Signed   By: Marijo Conception M.D.   On: 06/26/2020 08:15   CT Head Wo Contrast  Result Date: 06/26/2020 CLINICAL DATA:  Fall.  Found down. EXAM: CT HEAD WITHOUT CONTRAST CT MAXILLOFACIAL WITHOUT CONTRAST TECHNIQUE: Multidetector CT imaging of the head and maxillofacial structures were performed using the standard protocol without intravenous contrast. Multiplanar CT image reconstructions of the maxillofacial structures were also generated. COMPARISON:  CT head December 30, 2019. CT cervical spine September 11, 2017. FINDINGS: CT HEAD FINDINGS Brain: No evidence of acute infarction, hemorrhage, hydrocephalus, extra-axial collection or mass lesion/mass effect. Similar patchy hypodensities within the white matter, likely related to chronic microvascular ischemic disease. Similar generalized cerebral atrophy with ex vacuo ventricular dilation. Vascular: Calcific atherosclerosis. No  hyperdense vessel identified. Skull: No acute fracture. Other: No mastoid effusions. CT MAXILLOFACIAL FINDINGS Osseous: No fracture or mandibular dislocation. No destructive process. Dental disease with multiple missing teeth, periapical lucencies and dental caries. Orbits: Negative. No traumatic or inflammatory finding. Sinuses: Near complete opacification of the left maxillary sinus with areas of internal hyperdensity. Bony wall thickening of the surrounding maxillary sinus wall suggests this is a chronic process. Wind left maxillary ostium and medial bowing and thinning of the medial maxillary sinus wall with narrowed left medial meatus. Mucosal thickening of the left anterior ethmoid air cells. Soft tissues: Negative. CT CERVICAL FINDINGS Alignment: Similar to prior. Straightening of the normal cervical lordosis with slight anterolisthesis of C4 on C5. Skull base and vertebra: No acute fracture. Similar corticated fragment along the right occipital condyle, likely degenerative or secondary to remote trauma. Soft tissues and canal: No prevertebral fluid or swelling. No visible canal hematoma. Disc levels: Similar moderate degenerative disease at C5-C6 with disc height loss and endplate sclerosis. Multilevel facet/uncovertebral hypertrophy, greatest on the left at C4-C5 where there is foraminal narrowing. Upper chest: Clear visualized lung apices. IMPRESSION: CT head: No evidence of acute intracranial abnormality. CT Maxillofacial: 1. No acute fracture. 2. 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 Areas of internal hyperdensity may represent inspissated secretions and/or fungal colonization. There is widening of the left maxillary ostium and medial bowing and thinning or dehiscence of the medial maxillary sinus wall, suggesting possible underlying obstructing mass (most likely a polyp). Recommend correlation with direct inspection. CT cervical  spine: 1. No evidence of acute fracture or traumatic malalignment. 2. Moderate degenerative disc disease at C5-C6. Focal left facet arthropathy at C4-C5 where there is foraminal stenosis. MRI could further characterize if clinically indicated. Electronically Signed   By: Margaretha Sheffield MD   On: 06/26/2020 08:40   CT Cervical Spine Wo Contrast  Result Date: 06/26/2020 CLINICAL DATA:  Fall.  Found down. EXAM: CT HEAD WITHOUT CONTRAST CT MAXILLOFACIAL WITHOUT CONTRAST TECHNIQUE: Multidetector CT imaging of the head and maxillofacial structures were performed using the standard protocol without intravenous contrast. Multiplanar CT image reconstructions of the maxillofacial structures were also generated. COMPARISON:  CT head December 30, 2019. CT cervical spine September 11, 2017. FINDINGS: CT HEAD FINDINGS Brain: No evidence of acute infarction, hemorrhage, hydrocephalus, extra-axial collection or mass lesion/mass effect. Similar patchy hypodensities within the white matter, likely related to chronic microvascular ischemic disease. Similar generalized cerebral atrophy with ex vacuo ventricular dilation. Vascular:  Calcific atherosclerosis. No hyperdense vessel identified. Skull: No acute fracture. Other: No mastoid effusions. CT MAXILLOFACIAL FINDINGS Osseous: No fracture or mandibular dislocation. No destructive process. Dental disease with multiple missing teeth, periapical lucencies and dental caries. Orbits: Negative. No traumatic or inflammatory finding. Sinuses: Near complete opacification of the left maxillary sinus with areas of internal hyperdensity. Bony wall thickening of the surrounding maxillary sinus wall suggests this is a chronic process. Wind left maxillary ostium and medial bowing and thinning of the medial maxillary sinus wall with narrowed left medial meatus. Mucosal thickening of the left anterior ethmoid air cells. Soft tissues: Negative. CT CERVICAL FINDINGS Alignment: Similar to prior.  Straightening of the normal cervical lordosis with slight anterolisthesis of C4 on C5. Skull base and vertebra: No acute fracture. Similar corticated fragment along the right occipital condyle, likely degenerative or secondary to remote trauma. Soft tissues and canal: No prevertebral fluid or swelling. No visible canal hematoma. Disc levels: Similar moderate degenerative disease at C5-C6 with disc height loss and endplate sclerosis. Multilevel facet/uncovertebral hypertrophy, greatest on the left at C4-C5 where there is foraminal narrowing. Upper chest: Clear visualized lung apices. IMPRESSION: CT head: No evidence of acute intracranial abnormality. CT Maxillofacial: 1. No acute fracture. 2. 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 Areas of internal hyperdensity may represent inspissated secretions and/or fungal colonization. There is widening of the left maxillary ostium and medial bowing and thinning or dehiscence of the medial maxillary sinus wall, suggesting possible underlying obstructing mass (most likely a polyp). Recommend correlation with direct inspection. CT cervical spine: 1. No evidence of acute fracture or traumatic malalignment. 2. Moderate degenerative disc disease at C5-C6. Focal left facet arthropathy at C4-C5 where there is foraminal stenosis. MRI could further characterize if clinically indicated. Electronically Signed   By: Margaretha Sheffield MD   On: 06/26/2020 08:40   CT Maxillofacial Wo Contrast  Result Date: 06/26/2020 CLINICAL DATA:  Fall.  Found down. EXAM: CT HEAD WITHOUT CONTRAST CT MAXILLOFACIAL WITHOUT CONTRAST TECHNIQUE: Multidetector CT imaging of the head and maxillofacial structures were performed using the standard protocol without intravenous contrast. Multiplanar CT image reconstructions of the maxillofacial structures were also generated. COMPARISON:  CT head December 30, 2019. CT cervical spine September 11, 2017. FINDINGS: CT HEAD FINDINGS Brain: No evidence of acute infarction, hemorrhage, hydrocephalus, extra-axial collection or mass lesion/mass effect. Similar patchy hypodensities within the white matter, likely related to chronic microvascular ischemic disease. Similar generalized cerebral atrophy with ex vacuo ventricular dilation. Vascular: Calcific atherosclerosis. No hyperdense vessel identified. Skull: No acute fracture. Other: No mastoid effusions. CT MAXILLOFACIAL FINDINGS Osseous: No fracture or mandibular dislocation. No destructive process. Dental disease with multiple missing teeth, periapical lucencies and dental caries. Orbits: Negative. No traumatic or inflammatory finding. Sinuses: Near complete opacification of the left maxillary sinus with areas of internal hyperdensity. Bony wall thickening of the surrounding maxillary sinus wall suggests this is a chronic process. Wind left maxillary ostium and medial bowing and thinning of the medial maxillary sinus wall with narrowed left medial meatus. Mucosal thickening of the left anterior ethmoid air cells. Soft tissues: Negative. CT CERVICAL FINDINGS Alignment: Similar to prior. Straightening of the normal cervical lordosis with slight anterolisthesis of C4 on C5. Skull base and vertebra: No acute fracture. Similar corticated fragment along the right occipital condyle, likely degenerative or secondary to remote trauma. Soft tissues and canal: No prevertebral fluid or swelling. No visible canal hematoma. Disc levels: Similar moderate  degenerative disease at C5-C6 with disc height loss and endplate sclerosis. Multilevel facet/uncovertebral hypertrophy, greatest on the left at C4-C5 where there is foraminal narrowing. Upper chest: Clear visualized lung apices. IMPRESSION: CT head: No evidence of acute intracranial abnormality. CT Maxillofacial: 1. No acute fracture. 2. 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 Areas of internal hyperdensity may represent inspissated secretions and/or fungal colonization. There is widening of the left maxillary ostium and medial bowing and thinning or dehiscence of the medial maxillary sinus wall, suggesting possible underlying obstructing mass (most likely a polyp). Recommend correlation with direct inspection. CT cervical spine: 1. No evidence of acute fracture or traumatic malalignment. 2. Moderate degenerative disc disease at C5-C6. Focal left facet arthropathy at C4-C5 where there is foraminal stenosis. MRI could further characterize if clinically indicated. Electronically Signed   By: Margaretha Sheffield MD   On: 06/26/2020 08:40     Assessment/Plan Principal Problem:   AMS (altered mental status) Active Problems:   Alcohol dependence ([place])    Altered mental status Unclear etiology Rule out seizure versus syncope Patient has a history of ??  Seizures noted in the record.  Unable to confirm from family awaiting callback from his wife. He was found on the ground and had urinary and fecal incontinence but no witnessed seizure episode. We will place patient on seizure precautions Orthostatic blood pressure checks Place patient on cardiac monitor to rule out arrhythmias Obtain 2D echocardiogram to assess LVEF     Alcohol dependence Monitor closely for signs and symptoms of alcohol withdrawal. Will place on CIWA protocol and administer lorazepam for CIWA score of 8 or greater Place patient on thiamine and folic acid    Hypoglycemia Probably related to poor oral intake Place patient on dextrose containing fluids Check blood sugars every 4 hours Encourage oral intake  DVT prophylaxis: Lovenox Code Status: full code Family Communication: Called patient's wife Ms. Lurline Del, unable to leave a voicemail.  Disposition Plan: Back to previous home environment Consults called: none Status: Observation    Skiler Tye  MD [facility]     06/26/2020, 10:05 AM

## 2020-06-26 NOTE — ED Notes (Signed)
Renee RN aware of assigned bed

## 2020-06-26 NOTE — ED Triage Notes (Addendum)
Pt via EMS from home. Pt was found on the ground outside of his apartment by neighbor around 0700 this morning. Pt has a hx of seizures. Per EMS, neighbor states that pt usually sits outside every morning. Pt states, "I passed out." Pt denies pain. Pt is confused and alert to self only. Pt has an abrasion on his L cheek. Pt found with urinary and fecal incontinence.

## 2020-06-27 ENCOUNTER — Observation Stay: Admit: 2020-06-27 | Payer: Medicare HMO

## 2020-06-27 ENCOUNTER — Observation Stay: Payer: Medicare HMO

## 2020-06-27 DIAGNOSIS — G9340 Encephalopathy, unspecified: Secondary | ICD-10-CM | POA: Diagnosis present

## 2020-06-27 DIAGNOSIS — F17210 Nicotine dependence, cigarettes, uncomplicated: Secondary | ICD-10-CM | POA: Diagnosis present

## 2020-06-27 DIAGNOSIS — F1020 Alcohol dependence, uncomplicated: Secondary | ICD-10-CM | POA: Diagnosis not present

## 2020-06-27 DIAGNOSIS — R410 Disorientation, unspecified: Secondary | ICD-10-CM | POA: Diagnosis not present

## 2020-06-27 DIAGNOSIS — R32 Unspecified urinary incontinence: Secondary | ICD-10-CM | POA: Diagnosis present

## 2020-06-27 DIAGNOSIS — G9349 Other encephalopathy: Secondary | ICD-10-CM | POA: Diagnosis present

## 2020-06-27 DIAGNOSIS — I10 Essential (primary) hypertension: Secondary | ICD-10-CM | POA: Diagnosis present

## 2020-06-27 DIAGNOSIS — E162 Hypoglycemia, unspecified: Secondary | ICD-10-CM | POA: Diagnosis not present

## 2020-06-27 DIAGNOSIS — E1151 Type 2 diabetes mellitus with diabetic peripheral angiopathy without gangrene: Secondary | ICD-10-CM | POA: Diagnosis present

## 2020-06-27 DIAGNOSIS — R4182 Altered mental status, unspecified: Secondary | ICD-10-CM | POA: Diagnosis present

## 2020-06-27 DIAGNOSIS — Z8673 Personal history of transient ischemic attack (TIA), and cerebral infarction without residual deficits: Secondary | ICD-10-CM | POA: Diagnosis not present

## 2020-06-27 DIAGNOSIS — Z20822 Contact with and (suspected) exposure to covid-19: Secondary | ICD-10-CM | POA: Diagnosis present

## 2020-06-27 DIAGNOSIS — N400 Enlarged prostate without lower urinary tract symptoms: Secondary | ICD-10-CM | POA: Diagnosis present

## 2020-06-27 DIAGNOSIS — R159 Full incontinence of feces: Secondary | ICD-10-CM | POA: Diagnosis present

## 2020-06-27 DIAGNOSIS — E785 Hyperlipidemia, unspecified: Secondary | ICD-10-CM | POA: Diagnosis present

## 2020-06-27 DIAGNOSIS — E11649 Type 2 diabetes mellitus with hypoglycemia without coma: Secondary | ICD-10-CM | POA: Diagnosis present

## 2020-06-27 DIAGNOSIS — Z23 Encounter for immunization: Secondary | ICD-10-CM | POA: Diagnosis not present

## 2020-06-27 LAB — GLUCOSE, CAPILLARY
Glucose-Capillary: 132 mg/dL — ABNORMAL HIGH (ref 70–99)
Glucose-Capillary: 145 mg/dL — ABNORMAL HIGH (ref 70–99)
Glucose-Capillary: 55 mg/dL — ABNORMAL LOW (ref 70–99)
Glucose-Capillary: 62 mg/dL — ABNORMAL LOW (ref 70–99)
Glucose-Capillary: 67 mg/dL — ABNORMAL LOW (ref 70–99)
Glucose-Capillary: 67 mg/dL — ABNORMAL LOW (ref 70–99)
Glucose-Capillary: 74 mg/dL (ref 70–99)
Glucose-Capillary: 75 mg/dL (ref 70–99)
Glucose-Capillary: 76 mg/dL (ref 70–99)
Glucose-Capillary: 84 mg/dL (ref 70–99)

## 2020-06-27 LAB — BASIC METABOLIC PANEL
Anion gap: 8 (ref 5–15)
BUN: 8 mg/dL (ref 8–23)
CO2: 25 mmol/L (ref 22–32)
Calcium: 8.8 mg/dL — ABNORMAL LOW (ref 8.9–10.3)
Chloride: 107 mmol/L (ref 98–111)
Creatinine, Ser: 0.66 mg/dL (ref 0.61–1.24)
GFR, Estimated: >60 mL/min (ref >60)
Glucose, Bld: 85 mg/dL (ref 70–99)
Potassium: 3.5 mmol/L (ref 3.5–5.1)
Sodium: 140 mmol/L (ref 135–145)

## 2020-06-27 LAB — CBC
HCT: 39.3 % (ref 39.0–52.0)
Hemoglobin: 13 g/dL (ref 13.0–17.0)
MCH: 31.3 pg (ref 26.0–34.0)
MCHC: 33.1 g/dL (ref 30.0–36.0)
MCV: 94.7 fL (ref 80.0–100.0)
Platelets: 181 10*3/uL (ref 150–400)
RBC: 4.15 MIL/uL — ABNORMAL LOW (ref 4.22–5.81)
RDW: 14.2 % (ref 11.5–15.5)
WBC: 6.4 10*3/uL (ref 4.0–10.5)
nRBC: 0 % (ref 0.0–0.2)

## 2020-06-27 MED ORDER — DEXTROSE 50 % IV SOLN
INTRAVENOUS | Status: AC
  Filled 2020-06-27: qty 50

## 2020-06-27 MED ORDER — DEXTROSE-NACL 5-0.45 % IV SOLN: INTRAVENOUS | Status: AC

## 2020-06-27 MED ORDER — DEXTROSE 50 % IV SOLN
INTRAVENOUS | Status: AC
  Administered 2020-06-27: 25 mL
  Filled 2020-06-27: qty 50

## 2020-06-27 NOTE — Progress Notes (Addendum)
PROGRESS NOTE  Peter Howard.  BLT:[id number] DOB: [birth year] DOA: 06/26/2020 PCP: Rusty Aus, MD   Brief Narrative: Peter Younglove. is a 75 y.o. male with a suspected history of alcohol abuse who presented to the ED by EMS after being found incontinent and confused on the ground outside his apartment around 7am. Glucose was 67mg /dl, though otherwise labs were unremarkable. UDS and alcohol level negative. CT head nonacute, CXR negative. Maxillofacial CT showed chronic sinus disease as well as changes possibly related to nasal polyp. Cervical spine CT showed no acute fracture or malalignment. He has been oriented only to himself and we have been unable to corroborate baseline mentation with family.   Per alternative chart in Epic: PMH includes DM, HTN, HLD, PVD, CVA/TIA, and BPH. Medications including aricept, remeron, omeprazole, and tamsulosin are included in ED note from earlier this month which were all listed as "not taking" per patient. There is also an encounter in the [facility] ED from 10/31 - 11/5 under very similar circumstances to this presentation where the patient was kept awaiting placement not under IVC. It is unclear where he went on 11/5.   Assessment & Plan: Principal Problem:   AMS (altered mental status) Active Problems:   Alcohol dependence ([place])   Hypoglycemia   Acute encephalopathy  Found down:  - Continue telemetry monitoring for syncope evaluation. He's not cooperative with much of the care provided including echocardiogram. He has NSR on ECG and no clinical evidence of CHF, no exam evidence of valvular heart disease. Severe structural heart disease is unlikely. We will not sedate the patient for the echo at this time. - Seizure precautions. If any seizure-like activity would perform EEG.   Encephalopathy: Presumably acute, possibly acute on chronic. CT without hemorrhage/mass, tox screen and EtOH level negative. No severe metabolic derangements that would be  causative. ?If prolonged hypoglycemia could be causative.  - Continue sitter at bedside for patient safety.   History of alcohol abuse:  - CIWA as ordered, though will DC this if no evidence of withdrawal emerges in next 24 hours.  Hypoglycemia: Mild. ?If impaired gluconeogenesis from alcohol use in the past and being underweight suggests decreased available stores.  - Continue D5 in IVF and CBG checks.   Maxillary sinus abnormalities on CT:  - Will recommend ENT follow up.  DVT prophylaxis: Lovenox Code Status: Full Family Communication: None at bedside. I did get the patient's step daughter on the phone who is on the way here from [place] and will come to the hospital at that time. Disposition Plan:  Status is: Inpatient  Remains inpatient appropriate because:Altered mental status  Dispo: The patient is from: Home              Anticipated d/c is to: Home              Patient currently is not medically stable to d/c.   Difficult to place patient No  Consultants:   None  Procedures:   None  Antimicrobials:  None   Subjective: Confused, not cooperative with history, sleeping.  Objective: Vitals:   06/27/20 0435 06/27/20 0512 06/27/20 0807 06/27/20 1205  BP: 101/65  104/62 122/81  Pulse: (!) 52 (!) 57 65 (!) 52  Resp: 18  16 19   Temp: 97.6 F (36.4 C)  97.6 F (36.4 C) (!) 97.5 F (36.4 C)  TempSrc: Oral     SpO2: 100%  99% 100%  Weight:      Height:  Intake/Output Summary (Last 24 hours) at 06/27/2020 1522 Last data filed at 06/27/2020 0500 Gross per 24 hour  Intake 144.91 ml  Output 600 ml  Net -455.09 ml   Filed Weights   06/26/20 0727 06/26/20 2100  Weight: 62.6 kg 55.8 kg    Gen: Thin chronically ill-appearing male in no acute distress Pulm: Non-labored breathing. Clear to auscultation bilaterally.  CV: Regular rate and rhythm. No murmur, rub, or gallop. No JVD, no pedal edema. GI: Abdomen soft, non-tender, non-distended, with normoactive  bowel sounds. No organomegaly or masses felt. Ext: Warm, no deformities Skin: No rashes, lesions or ulcers on visualized skin Neuro: Drowsy, rousable, not oriented and not cooperative with exam. Psych: UTD. No agitation.  Data Reviewed: I have personally reviewed following labs and imaging studies  CBC: Recent Labs  Lab 06/26/20 0725 06/27/20 0426  WBC 9.7 6.4  NEUTROABS 7.6  --   HGB 14.0 13.0  HCT 42.9 39.3  MCV 95.8 94.7  PLT 158 409   Basic Metabolic Panel: Recent Labs  Lab 06/26/20 0810 06/26/20 1001 06/27/20 0426  NA 139  --  140  K 3.8  --  3.5  CL 108  --  107  CO2 18*  --  25  GLUCOSE 68*  --  85  BUN 13  --  8  CREATININE 0.71  --  0.66  CALCIUM 8.6*  --  8.8*  MG  --  1.6*  --   PHOS  --  3.3  --    GFR: Estimated Creatinine Clearance: 63.9 mL/min (by C-G formula based on SCr of 0.66 mg/dL). Liver Function Tests: Recent Labs  Lab 06/26/20 0810  AST 17  ALT 11  ALKPHOS 55  BILITOT 1.3*  PROT 7.0  ALBUMIN 3.7   No results for input(s): LIPASE, AMYLASE in the last 168 hours. Recent Labs  Lab 06/26/20 0810  AMMONIA 12   Coagulation Profile: No results for input(s): INR, PROTIME in the last 168 hours. Cardiac Enzymes: Recent Labs  Lab 06/26/20 0810  CKTOTAL 127   BNP (last 3 results) No results for input(s): PROBNP in the last 8760 hours. HbA1C: No results for input(s): HGBA1C in the last 72 hours. CBG: Recent Labs  Lab 06/27/20 0004 06/27/20 0116 06/27/20 0436 06/27/20 0807 06/27/20 1205  GLUCAP 67* 132* 84 75 76   Lipid Profile: No results for input(s): CHOL, HDL, LDLCALC, TRIG, CHOLHDL, LDLDIRECT in the last 72 hours. Thyroid Function Tests: No results for input(s): TSH, T4TOTAL, FREET4, T3FREE, THYROIDAB in the last 72 hours. Anemia Panel: No results for input(s): VITAMINB12, FOLATE, FERRITIN, TIBC, IRON, RETICCTPCT in the last 72 hours. Urine analysis:    Component Value Date/Time   COLORURINE YELLOW (A) 06/26/2020 0713    APPEARANCEUR CLEAR (A) 06/26/2020 0713   LABSPEC 1.019 06/26/2020 0713   PHURINE 6.0 06/26/2020 0713   GLUCOSEU NEGATIVE 06/26/2020 0713   HGBUR NEGATIVE 06/26/2020 0713   BILIRUBINUR NEGATIVE 06/26/2020 0713   KETONESUR 20 (A) 06/26/2020 0713   PROTEINUR NEGATIVE 06/26/2020 0713   NITRITE NEGATIVE 06/26/2020 0713   LEUKOCYTESUR NEGATIVE 06/26/2020 0713   Recent Results (from the past 240 hour(s))  Resp Panel by RT-PCR (Flu A&B, Covid) Nasopharyngeal Swab     Status: None   Collection Time: 06/26/20  7:25 AM   Specimen: Nasopharyngeal Swab; Nasopharyngeal(NP) swabs in vial transport medium  Result Value Ref Range Status   SARS Coronavirus 2 by RT PCR NEGATIVE NEGATIVE Final    Comment: (NOTE) SARS-CoV-2 target  nucleic acids are NOT DETECTED.  The SARS-CoV-2 RNA is generally detectable in upper respiratory specimens during the acute phase of infection. The lowest concentration of SARS-CoV-2 viral copies this assay can detect is 138 copies/mL. A negative result does not preclude SARS-Cov-2 infection and should not be used as the sole basis for treatment or other patient management decisions. A negative result may occur with  improper specimen collection/handling, submission of specimen other than nasopharyngeal swab, presence of viral mutation(s) within the areas targeted by this assay, and inadequate number of viral copies(<138 copies/mL). A negative result must be combined with clinical observations, patient history, and epidemiological information. The expected result is Negative.  Fact Sheet for Patients:  EntrepreneurPulse.com.au  Fact Sheet for Healthcare Providers:  IncredibleEmployment.be  This test is no t yet approved or cleared by the [place] FDA and  has been authorized for detection and/or diagnosis of SARS-CoV-2 by FDA under an Emergency Use Authorization (EUA). This EUA will remain  in effect (meaning this test can be  used) for the duration of the COVID-19 declaration under Section 564(b)(1) of the Act, 21 U.S.C.section 360bbb-3(b)(1), unless the authorization is terminated  or revoked sooner.       Influenza A by PCR NEGATIVE NEGATIVE Final   Influenza B by PCR NEGATIVE NEGATIVE Final    Comment: (NOTE) The Xpert Xpress SARS-CoV-2/FLU/RSV plus assay is intended as an aid in the diagnosis of influenza from Nasopharyngeal swab specimens and should not be used as a sole basis for treatment. Nasal washings and aspirates are unacceptable for Xpert Xpress SARS-CoV-2/FLU/RSV testing.  Fact Sheet for Patients: EntrepreneurPulse.com.au  Fact Sheet for Healthcare Providers: IncredibleEmployment.be  This test is not yet approved or cleared by the [place] FDA and has been authorized for detection and/or diagnosis of SARS-CoV-2 by FDA under an Emergency Use Authorization (EUA). This EUA will remain in effect (meaning this test can be used) for the duration of the COVID-19 declaration under Section 564(b)(1) of the Act, 21 U.S.C. section 360bbb-3(b)(1), unless the authorization is terminated or revoked.  Performed at [facility], [address] [place], [place] [address]       Radiology Studies: DG Chest 2 View  Result Date: 06/26/2020 CLINICAL DATA:  Fall. EXAM: CHEST - 2 VIEW COMPARISON:  February 24, 2019. FINDINGS: The heart size and mediastinal contours are within normal limits. Both lungs are clear. The visualized skeletal structures are unremarkable. IMPRESSION: No active cardiopulmonary disease. Electronically Signed   By: Marijo Conception M.D.   On: 06/26/2020 08:15   CT Head Wo Contrast  Result Date: 06/26/2020 CLINICAL DATA:  Fall.  Found down. EXAM: CT HEAD WITHOUT CONTRAST CT MAXILLOFACIAL WITHOUT CONTRAST TECHNIQUE: Multidetector CT imaging of the head and maxillofacial structures were performed using the standard protocol without  intravenous contrast. Multiplanar CT image reconstructions of the maxillofacial structures were also generated. COMPARISON:  CT head December 30, 2019. CT cervical spine September 11, 2017. FINDINGS: CT HEAD FINDINGS Brain: No evidence of acute infarction, hemorrhage, hydrocephalus, extra-axial collection or mass lesion/mass effect. Similar patchy hypodensities within the white matter, likely related to chronic microvascular ischemic disease. Similar generalized cerebral atrophy with ex vacuo ventricular dilation. Vascular: Calcific atherosclerosis. No hyperdense vessel identified. Skull: No acute fracture. Other: No mastoid effusions. CT MAXILLOFACIAL FINDINGS Osseous: No fracture or mandibular dislocation. No destructive process. Dental disease with multiple missing teeth, periapical lucencies and dental caries. Orbits: Negative. No traumatic or inflammatory finding. Sinuses: Near complete opacification of the left maxillary  sinus with areas of internal hyperdensity. Bony wall thickening of the surrounding maxillary sinus wall suggests this is a chronic process. Wind left maxillary ostium and medial bowing and thinning of the medial maxillary sinus wall with narrowed left medial meatus. Mucosal thickening of the left anterior ethmoid air cells. Soft tissues: Negative. CT CERVICAL FINDINGS Alignment: Similar to prior. Straightening of the normal cervical lordosis with slight anterolisthesis of C4 on C5. Skull base and vertebra: No acute fracture. Similar corticated fragment along the right occipital condyle, likely degenerative or secondary to remote trauma. Soft tissues and canal: No prevertebral fluid or swelling. No visible canal hematoma. Disc levels: Similar moderate degenerative disease at C5-C6 with disc height loss and endplate sclerosis. Multilevel facet/uncovertebral hypertrophy, greatest on the left at C4-C5 where there is foraminal narrowing. Upper chest: Clear visualized lung apices. IMPRESSION: CT head: No  evidence of acute intracranial abnormality. CT Maxillofacial: 1. No acute fracture. 2. 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 Areas of internal hyperdensity may represent inspissated secretions and/or fungal colonization. There is widening of the left maxillary ostium and medial bowing and thinning or dehiscence of the medial maxillary sinus wall, suggesting possible underlying obstructing mass (most likely a polyp). Recommend correlation with direct inspection. CT cervical spine: 1. No evidence of acute fracture or traumatic malalignment. 2. Moderate degenerative disc disease at C5-C6. Focal left facet arthropathy at C4-C5 where there is foraminal stenosis. MRI could further characterize if clinically indicated. Electronically Signed   By: Margaretha Sheffield MD   On: 06/26/2020 08:40   CT Cervical Spine Wo Contrast  Result Date: 06/26/2020 CLINICAL DATA:  Fall.  Found down. EXAM: CT HEAD WITHOUT CONTRAST CT MAXILLOFACIAL WITHOUT CONTRAST TECHNIQUE: Multidetector CT imaging of the head and maxillofacial structures were performed using the standard protocol without intravenous contrast. Multiplanar CT image reconstructions of the maxillofacial structures were also generated. COMPARISON:  CT head December 30, 2019. CT cervical spine September 11, 2017. FINDINGS: CT HEAD FINDINGS Brain: No evidence of acute infarction, hemorrhage, hydrocephalus, extra-axial collection or mass lesion/mass effect. Similar patchy hypodensities within the white matter, likely related to chronic microvascular ischemic disease. Similar generalized cerebral atrophy with ex vacuo ventricular dilation. Vascular: Calcific atherosclerosis. No hyperdense vessel identified. Skull: No acute fracture. Other: No mastoid effusions. CT MAXILLOFACIAL FINDINGS Osseous: No fracture or mandibular dislocation. No destructive process. Dental disease with multiple missing teeth, periapical  lucencies and dental caries. Orbits: Negative. No traumatic or inflammatory finding. Sinuses: Near complete opacification of the left maxillary sinus with areas of internal hyperdensity. Bony wall thickening of the surrounding maxillary sinus wall suggests this is a chronic process. Wind left maxillary ostium and medial bowing and thinning of the medial maxillary sinus wall with narrowed left medial meatus. Mucosal thickening of the left anterior ethmoid air cells. Soft tissues: Negative. CT CERVICAL FINDINGS Alignment: Similar to prior. Straightening of the normal cervical lordosis with slight anterolisthesis of C4 on C5. Skull base and vertebra: No acute fracture. Similar corticated fragment along the right occipital condyle, likely degenerative or secondary to remote trauma. Soft tissues and canal: No prevertebral fluid or swelling. No visible canal hematoma. Disc levels: Similar moderate degenerative disease at C5-C6 with disc height loss and endplate sclerosis. Multilevel facet/uncovertebral hypertrophy, greatest on the left at C4-C5 where there is foraminal narrowing. Upper chest: Clear visualized lung apices. IMPRESSION: CT head: No evidence of acute intracranial abnormality. CT Maxillofacial: 1. No acute fracture. 2. Chronic left maxillary and  anterior ethmoid sinus disease with near complete opacification left maxillary sinus. Areas of internal hyperdensity may represent inspissated secretions and/or fungal colonization. There is widening of the left maxillary ostium and medial bowing and thinning or dehiscence of the medial maxillary sinus wall, suggesting possible underlying obstructing mass (most likely a polyp). Recommend correlation with direct inspection. CT cervical spine: 1. No evidence of acute fracture or traumatic malalignment. 2. Moderate degenerative disc disease at C5-C6. Focal left facet arthropathy at C4-C5 where there is foraminal stenosis. MRI could further characterize if clinically  indicated. Electronically Signed   By: Margaretha Sheffield MD   On: 06/26/2020 08:40   MR BRAIN WO CONTRAST  Result Date: 06/26/2020 CLINICAL DATA:  Fall, confusion EXAM: MRI HEAD WITHOUT CONTRAST TECHNIQUE: Multiplanar, multiecho pulse sequences of the brain and surrounding structures were obtained without intravenous contrast. COMPARISON:  02/24/2019 FINDINGS: Motion artifact is present. Brain: There is no acute infarction or intracranial hemorrhage. There is no intracranial mass, mass effect, or edema. There is no hydrocephalus or extra-axial fluid collection. Prominence of the ventricles and sulci reflects generalized parenchymal volume loss. Patchy and confluent areas of T2 hyperintensity in the supratentorial white matter are nonspecific but probably reflect similar mild to moderate chronic microvascular ischemic changes. Susceptibility hypointensity is identified in right occipital sulci reflecting chronic subarachnoid blood products. Vascular: Major vessel flow voids at the skull base are preserved. Skull and upper cervical spine: Normal marrow signal is preserved. Sinuses/Orbits: Chronic left maxillary sinus opacification with tissue extending into the nasal cavity. Left anterior ethmoid mucosal thickening. Orbits are unremarkable. Other: Sella is unremarkable.  Mastoid air cells are clear. IMPRESSION: Motion degraded. No evidence of recent infarction, hemorrhage, or mass. No substantial change in parenchymal volume loss and chronic microvascular ischemic changes. Electronically Signed   By: Macy Mis M.D.   On: 06/26/2020 14:37   MR Cervical Spine Wo Contrast  Result Date: 06/26/2020 CLINICAL DATA:  Fall EXAM: MRI CERVICAL SPINE WITHOUT CONTRAST TECHNIQUE: Multiplanar, multisequence MR imaging of the cervical spine was performed. No intravenous contrast was administered. COMPARISON:  None. FINDINGS: Motion artifact is present. Axial GRE sequence was not obtained as the patient could not tolerate  additional time. Alignment: Trace retrolisthesis at C3-C4. Vertebrae: Vertebral body heights are maintained apart from mild degenerative plate irregularity. Mild marrow edema associated with the left C4-C5 facets on a degenerative basis. Cord: No abnormal signal. Posterior Fossa, vertebral arteries, paraspinal tissues: Intracranial findings dictated separately. No prevertebral edema. Disc levels: C2-C3:  Small endplate osteophytes.  No canal or foraminal stenosis. C3-C4: Disc bulge with endplate osteophytes. Uncovertebral and facet hypertrophy. Slight effacement of the ventral subarachnoid space without canal stenosis. Probable mild right foraminal stenosis. No left foraminal stenosis. C4-C5: Disc bulge with endplate osteophytes. Uncovertebral and facet hypertrophy. Slight effacement of the ventral subarachnoid space without canal stenosis. Probable mild to moderate right and marked left foraminal stenosis. C5-C6: Disc bulge with endplate osteophytes. Uncovertebral and facet hypertrophy. Slight effacement of the ventral subarachnoid space without canal stenosis. Probable mild to moderate right and marked left foraminal stenosis. C6-C7: Disc bulge with endplate osteophytes. Uncovertebral and facet hypertrophy. Slight effacement of the ventral subarachnoid space without canal stenosis. Mild right and marked left foraminal stenosis. C7-T1:  No canal or foraminal stenosis. IMPRESSION: Multilevel degenerative changes as detailed above. Suboptimal stenosis evaluation due to artifact. There is no significant canal narrowing. Multilevel left greater than right foraminal stenosis. Electronically Signed   By: Macy Mis M.D.   On: 06/26/2020 14:44  CT Maxillofacial Wo Contrast  Result Date: 06/26/2020 CLINICAL DATA:  Fall.  Found down. EXAM: CT HEAD WITHOUT CONTRAST CT MAXILLOFACIAL WITHOUT CONTRAST TECHNIQUE: Multidetector CT imaging of the head and maxillofacial structures were performed using the standard protocol  without intravenous contrast. Multiplanar CT image reconstructions of the maxillofacial structures were also generated. COMPARISON:  CT head December 30, 2019. CT cervical spine September 11, 2017. FINDINGS: CT HEAD FINDINGS Brain: No evidence of acute infarction, hemorrhage, hydrocephalus, extra-axial collection or mass lesion/mass effect. Similar patchy hypodensities within the white matter, likely related to chronic microvascular ischemic disease. Similar generalized cerebral atrophy with ex vacuo ventricular dilation. Vascular: Calcific atherosclerosis. No hyperdense vessel identified. Skull: No acute fracture. Other: No mastoid effusions. CT MAXILLOFACIAL FINDINGS Osseous: No fracture or mandibular dislocation. No destructive process. Dental disease with multiple missing teeth, periapical lucencies and dental caries. Orbits: Negative. No traumatic or inflammatory finding. Sinuses: Near complete opacification of the left maxillary sinus with areas of internal hyperdensity. Bony wall thickening of the surrounding maxillary sinus wall suggests this is a chronic process. Wind left maxillary ostium and medial bowing and thinning of the medial maxillary sinus wall with narrowed left medial meatus. Mucosal thickening of the left anterior ethmoid air cells. Soft tissues: Negative. CT CERVICAL FINDINGS Alignment: Similar to prior. Straightening of the normal cervical lordosis with slight anterolisthesis of C4 on C5. Skull base and vertebra: No acute fracture. Similar corticated fragment along the right occipital condyle, likely degenerative or secondary to remote trauma. Soft tissues and canal: No prevertebral fluid or swelling. No visible canal hematoma. Disc levels: Similar moderate degenerative disease at C5-C6 with disc height loss and endplate sclerosis. Multilevel facet/uncovertebral hypertrophy, greatest on the left at C4-C5 where there is foraminal narrowing. Upper chest: Clear visualized lung apices. IMPRESSION: CT  head: No evidence of acute intracranial abnormality. CT Maxillofacial: 1. No acute fracture. 2. 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 Areas of internal hyperdensity may represent inspissated secretions and/or fungal colonization. There is widening of the left maxillary ostium and medial bowing and thinning or dehiscence of the medial maxillary sinus wall, suggesting possible underlying obstructing mass (most likely a polyp). Recommend correlation with direct inspection. CT cervical spine: 1. No evidence of acute fracture or traumatic malalignment. 2. Moderate degenerative disc disease at C5-C6. Focal left facet arthropathy at C4-C5 where there is foraminal stenosis. MRI could further characterize if clinically indicated. Electronically Signed   By: Margaretha Sheffield MD   On: 06/26/2020 08:40    Scheduled Meds: . enoxaparin (LOVENOX) injection  40 mg Subcutaneous Q24H  . sodium chloride flush  3 mL Intravenous Q12H   Continuous Infusions: . dextrose 5 % and 0.45% NaCl       LOS: 0 days   Time spent: 25 minutes.  Patrecia Pour, MD [facility] [email or web address] 06/27/2020, 3:22 PM

## 2020-06-27 NOTE — Evaluation (Signed)
Physical Therapy Evaluation Patient Details Name: Peter Howard. MRN: [id number] DOB: 16-Jan-1946 Today's Date: 06/27/2020   History of Present Illness  Peter Howard. is a 75 y.o. male with medical history significant for alcohol dependence, ??  Seizure disorder to the ER via EMS for evaluation after he was found on the ground outside his apartment by his neighbor at about 7 AM.  MRI negative for acute CVA  Clinical Impression  Patient received sitting up on side of bed with sitter. Sitter reports he was trying to get to the [facility]. Patient sitting with mitts donned. He is confused, able to answer simple questions and follow one step commands. Patient was returned to bed as he was not safe to attempt transfer. Patient will continue to benefit from skilled PT while here to assess further mobility and safety for discharge planning.     Follow Up Recommendations Supervision/Assistance - 24 hour    Equipment Recommendations  Other (comment) (TBD)    Recommendations for Other Services       Precautions / Restrictions Precautions Precautions: Fall Restrictions Weight Bearing Restrictions: No      Mobility  Bed Mobility Overal bed mobility: Needs Assistance Bed Mobility: Supine to Sit;Sit to Supine;Rolling Rolling: Mod assist     Sit to supine: Min assist   General bed mobility comments: patient is limited by confusion    Transfers                 General transfer comment: not attempted due to safety concerns  Ambulation/Gait             General Gait Details: not attempted  Stairs            Wheelchair Mobility    Modified Rankin (Stroke Patients Only)       Balance Overall balance assessment: Needs assistance Sitting-balance support: No upper extremity supported;Feet unsupported Sitting balance-Leahy Scale: Poor Sitting balance - Comments: patient leaning posteriorly but is able to return to sitting without assist. Postural control: Posterior  lean                                   Pertinent Vitals/Pain Pain Assessment: Faces Faces Pain Scale: No hurt    Home Living Family/patient expects to be discharged to:: Private residence Living Arrangements: Spouse/significant other               Additional Comments: patient is unable to provide home information other than he lives with his wife.    Prior Function           Comments: unsure     Hand Dominance        Extremity/Trunk Assessment   Upper Extremity Assessment Upper Extremity Assessment: Generalized weakness    Lower Extremity Assessment Lower Extremity Assessment: Generalized weakness    Cervical / Trunk Assessment Cervical / Trunk Assessment: Normal  Communication   Communication: Expressive difficulties;Other (comment) (garbled speech)  Cognition Arousal/Alertness: Awake/alert Behavior During Therapy: Restless;Impulsive Overall Cognitive Status: No family/caregiver present to determine baseline cognitive functioning                                 General Comments: patient confused, having difficulty following directions, answering questions      General Comments      Exercises     Assessment/Plan    PT Assessment Patient  needs continued PT services  PT Problem List Decreased mobility;Decreased balance;Decreased cognition;Decreased safety awareness;Decreased coordination       PT Treatment Interventions DME instruction;Gait training;Functional mobility training;Therapeutic activities;Patient/family education;Balance training    PT Goals (Current goals can be found in the Care Plan section)  Acute Rehab PT Goals Patient Stated Goal: none stated PT Goal Formulation: Patient unable to participate in goal setting Time For Goal Achievement: 07/11/20    Frequency Min 2X/week   Barriers to discharge Decreased caregiver support      Co-evaluation               AM-PAC PT "6 Clicks" Mobility   Outcome Measure Help needed turning from your back to your side while in a flat bed without using bedrails?: A Lot Help needed moving from lying on your back to sitting on the side of a flat bed without using bedrails?: A Lot Help needed moving to and from a bed to a chair (including a wheelchair)?: Total Help needed standing up from a chair using your arms (e.g., wheelchair or bedside chair)?: Total Help needed to walk in hospital room?: Total Help needed climbing 3-5 steps with a railing? : Total 6 Click Score: 8    End of Session   Activity Tolerance: Other (comment) (limited by confusion) Patient left: in bed;with bed alarm set;with nursing/sitter in room Nurse Communication: Mobility status PT Visit Diagnosis: History of falling (Z91.81);Other abnormalities of gait and mobility (R26.89)    Time: 1415-1430 PT Time Calculation (min) (ACUTE ONLY): 15 min   Charges:   PT Evaluation $PT Eval Moderate Complexity: 1 Mod          Kahle Mcqueen, PT, GCS 06/27/20,3:02 PM

## 2020-06-28 LAB — GLUCOSE, CAPILLARY
Glucose-Capillary: 115 mg/dL — ABNORMAL HIGH (ref 70–99)
Glucose-Capillary: 123 mg/dL — ABNORMAL HIGH (ref 70–99)
Glucose-Capillary: 190 mg/dL — ABNORMAL HIGH (ref 70–99)
Glucose-Capillary: 56 mg/dL — ABNORMAL LOW (ref 70–99)
Glucose-Capillary: 71 mg/dL (ref 70–99)
Glucose-Capillary: 90 mg/dL (ref 70–99)
Glucose-Capillary: 93 mg/dL (ref 70–99)

## 2020-06-28 MED ORDER — DEXTROSE 50 % IV SOLN
INTRAVENOUS | Status: AC
  Filled 2020-06-28: qty 50

## 2020-06-28 MED ORDER — DEXTROSE 10 % IV SOLN: INTRAVENOUS | Status: DC

## 2020-06-28 NOTE — Discharge Summary (Signed)
Physician Discharge Summary  [name] [id number] DOB: May 06, 1945 DOA: 06/26/2020  PCP: Rusty Aus, MD  Admit date: 06/26/2020 Discharge date: 06/28/2020  Admitted From: Home Disposition: Home   Recommendations for Outpatient Follow-up:  1. Follow up with PCP in 1-2 weeks 2. Avoid hypoglycemic agents.  3. Consider ENT follow up (see maxillofacial CT report below detailing suspected nasal polyp)  Home Health: Pt's family in process of getting home health services through the [place].  Equipment/Devices: None Discharge Condition: Stable CODE STATUS: Full Diet recommendation: As tolerated  Brief/Interim Summary: Peter Howard. is a 75 y.o. male with a suspected history of alcohol abuse who presented to the ED by EMS after being found incontinent and confused on the ground outside his apartment around 7am. Glucose was 67mg /dl, though otherwise labs were unremarkable. UDS and alcohol level negative. CT head nonacute, CXR negative. Maxillofacial CT showed chronic sinus disease as well as changes possibly related to nasal polyp. Cervical spine CT showed no acute fracture or malalignment. He has been oriented only to himself and we have been unable to corroborate baseline mentation with family.   Per alternative chart in Epic: PMH includes DM, HTN, HLD, PVD, CVA/TIA, and BPH. Medications including aricept, remeron, omeprazole, and tamsulosin are included in ED note from earlier this month which were all listed as "not taking" per patient. There is also an encounter in the [facility] ED from 10/31 - 11/5 under very similar circumstances to this presentation where the patient was kept awaiting placement not under IVC. It is unclear where he went on 11/5.  Family presented to the hospital for corroborative information on 4/30. They state he takes no medications, has progressive confusion, but no new changes. He appears at his mental baseline "when he's in the hospital." They are comfortable  taking him home as prolonging hospitalization presents greater risks than potential benefits. [facility] would [facility] and are in the process of doing so.  Discharge Diagnoses:  Principal Problem:   AMS (altered mental status) Active Problems:   Alcohol dependence ([place])   Hypoglycemia   Acute encephalopathy  Found down:  - No dysrhythmias noted on highly artifect-ridden telemetry monitoring  - No seizure like activity noted  Encephalopathy: Presumably acute, possibly acute on chronic. CT without hemorrhage/mass, tox screen and EtOH level negative. No severe metabolic derangements that would be causative. ?If prolonged hypoglycemia could be causative.  - Family reports this is chronic, perhaps slightly progressed as he has been found wandering more frequently of late. - Could consider thiamine supplementation, though family is sure that he will not take this.  History of alcohol abuse: No evidence of intoxication or withdrawal.  Hypoglycemia: Pt will be more willing to eat at home, glucose rises predictably when he takes po. Suspect impaired gluconeogenesis from alcohol use in the past and chronic malnutrition - Avoid hypoglycemic agents  Maxillary sinus abnormalities on CT:  - Will recommend ENT follow up.  Discharge Instructions  Allergies as of 06/28/2020   No Known Allergies     Medication List    You have not been prescribed any medications.     Follow-up Information    Rusty Aus, MD Follow up.   Specialty: Internal Medicine Contact information: [place] [address] [phone]              No Known Allergies  Consultations:  None  Procedures/Studies:  DG Chest 2 View  Result Date: 06/26/2020 CLINICAL DATA:  Fall. EXAM: CHEST - 2 VIEW COMPARISON:  February 24, 2019. FINDINGS: The heart size and mediastinal contours are within  normal limits. Both lungs are clear. The visualized skeletal structures are unremarkable. IMPRESSION: No active cardiopulmonary disease. Electronically Signed   By: Marijo Conception M.D.   On: 06/26/2020 08:15   CT Head Wo Contrast  Result Date: 06/26/2020 CLINICAL DATA:  Fall.  Found down. EXAM: CT HEAD WITHOUT CONTRAST CT MAXILLOFACIAL WITHOUT CONTRAST TECHNIQUE: Multidetector CT imaging of the head and maxillofacial structures were performed using the standard protocol without intravenous contrast. Multiplanar CT image reconstructions of the maxillofacial structures were also generated. COMPARISON:  CT head December 30, 2019. CT cervical spine September 11, 2017. FINDINGS: CT HEAD FINDINGS Brain: No evidence of acute infarction, hemorrhage, hydrocephalus, extra-axial collection or mass lesion/mass effect. Similar patchy hypodensities within the white matter, likely related to chronic microvascular ischemic disease. Similar generalized cerebral atrophy with ex vacuo ventricular dilation. Vascular: Calcific atherosclerosis. No hyperdense vessel identified. Skull: No acute fracture. Other: No mastoid effusions. CT MAXILLOFACIAL FINDINGS Osseous: No fracture or mandibular dislocation. No destructive process. Dental disease with multiple missing teeth, periapical lucencies and dental caries. Orbits: Negative. No traumatic or inflammatory finding. Sinuses: Near complete opacification of the left maxillary sinus with areas of internal hyperdensity. Bony wall thickening of the surrounding maxillary sinus wall suggests this is a chronic process. Wind left maxillary ostium and medial bowing and thinning of the medial maxillary sinus wall with narrowed left medial meatus. Mucosal thickening of the left anterior ethmoid air cells. Soft tissues: Negative. CT CERVICAL FINDINGS Alignment: Similar to prior. Straightening of the normal cervical lordosis with slight anterolisthesis of C4 on C5. Skull base and vertebra: No acute  fracture. Similar corticated fragment along the right occipital condyle, likely degenerative or secondary to remote trauma. Soft tissues and canal: No prevertebral fluid or swelling. No visible canal hematoma. Disc levels: Similar moderate degenerative disease at C5-C6 with disc height loss and endplate sclerosis. Multilevel facet/uncovertebral hypertrophy, greatest on the left at C4-C5 where there is foraminal narrowing. Upper chest: Clear visualized lung apices. IMPRESSION: CT head: No evidence of acute intracranial abnormality. CT Maxillofacial: 1. No acute fracture. 2. 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 Areas of internal hyperdensity may represent inspissated secretions and/or fungal colonization. There is widening of the left maxillary ostium and medial bowing and thinning or dehiscence of the medial maxillary sinus wall, suggesting possible underlying obstructing mass (most likely a polyp). Recommend correlation with direct inspection. CT cervical spine: 1. No evidence of acute fracture or traumatic malalignment. 2. Moderate degenerative disc disease at C5-C6. Focal left facet arthropathy at C4-C5 where there is foraminal stenosis. MRI could further characterize if clinically indicated. Electronically Signed   By: Margaretha Sheffield MD   On: 06/26/2020 08:40   CT Cervical Spine Wo Contrast  Result Date: 06/26/2020 CLINICAL DATA:  Fall.  Found down. EXAM: CT HEAD WITHOUT CONTRAST CT MAXILLOFACIAL WITHOUT CONTRAST TECHNIQUE: Multidetector CT imaging of the head and maxillofacial structures were performed using the standard protocol without intravenous contrast. Multiplanar CT image reconstructions of the maxillofacial structures were also generated. COMPARISON:  CT head December 30, 2019. CT cervical spine September 11, 2017. FINDINGS: CT HEAD FINDINGS Brain: No evidence of acute infarction, hemorrhage, hydrocephalus, extra-axial  collection or mass lesion/mass effect. Similar patchy hypodensities within the white matter, likely related to chronic microvascular  ischemic disease. Similar generalized cerebral atrophy with ex vacuo ventricular dilation. Vascular: Calcific atherosclerosis. No hyperdense vessel identified. Skull: No acute fracture. Other: No mastoid effusions. CT MAXILLOFACIAL FINDINGS Osseous: No fracture or mandibular dislocation. No destructive process. Dental disease with multiple missing teeth, periapical lucencies and dental caries. Orbits: Negative. No traumatic or inflammatory finding. Sinuses: Near complete opacification of the left maxillary sinus with areas of internal hyperdensity. Bony wall thickening of the surrounding maxillary sinus wall suggests this is a chronic process. Wind left maxillary ostium and medial bowing and thinning of the medial maxillary sinus wall with narrowed left medial meatus. Mucosal thickening of the left anterior ethmoid air cells. Soft tissues: Negative. CT CERVICAL FINDINGS Alignment: Similar to prior. Straightening of the normal cervical lordosis with slight anterolisthesis of C4 on C5. Skull base and vertebra: No acute fracture. Similar corticated fragment along the right occipital condyle, likely degenerative or secondary to remote trauma. Soft tissues and canal: No prevertebral fluid or swelling. No visible canal hematoma. Disc levels: Similar moderate degenerative disease at C5-C6 with disc height loss and endplate sclerosis. Multilevel facet/uncovertebral hypertrophy, greatest on the left at C4-C5 where there is foraminal narrowing. Upper chest: Clear visualized lung apices. IMPRESSION: CT head: No evidence of acute intracranial abnormality. CT Maxillofacial: 1. No acute fracture. 2. 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 Areas of internal hyperdensity may represent inspissated secretions and/or fungal  colonization. There is widening of the left maxillary ostium and medial bowing and thinning or dehiscence of the medial maxillary sinus wall, suggesting possible underlying obstructing mass (most likely a polyp). Recommend correlation with direct inspection. CT cervical spine: 1. No evidence of acute fracture or traumatic malalignment. 2. Moderate degenerative disc disease at C5-C6. Focal left facet arthropathy at C4-C5 where there is foraminal stenosis. MRI could further characterize if clinically indicated. Electronically Signed   By: Margaretha Sheffield MD   On: 06/26/2020 08:40   MR BRAIN WO CONTRAST  Result Date: 06/26/2020 CLINICAL DATA:  Fall, confusion EXAM: MRI HEAD WITHOUT CONTRAST TECHNIQUE: Multiplanar, multiecho pulse sequences of the brain and surrounding structures were obtained without intravenous contrast. COMPARISON:  02/24/2019 FINDINGS: Motion artifact is present. Brain: There is no acute infarction or intracranial hemorrhage. There is no intracranial mass, mass effect, or edema. There is no hydrocephalus or extra-axial fluid collection. Prominence of the ventricles and sulci reflects generalized parenchymal volume loss. Patchy and confluent areas of T2 hyperintensity in the supratentorial white matter are nonspecific but probably reflect similar mild to moderate chronic microvascular ischemic changes. Susceptibility hypointensity is identified in right occipital sulci reflecting chronic subarachnoid blood products. Vascular: Major vessel flow voids at the skull base are preserved. Skull and upper cervical spine: Normal marrow signal is preserved. Sinuses/Orbits: Chronic left maxillary sinus opacification with tissue extending into the nasal cavity. Left anterior ethmoid mucosal thickening. Orbits are unremarkable. Other: Sella is unremarkable.  Mastoid air cells are clear. IMPRESSION: Motion degraded. No evidence of recent infarction, hemorrhage, or mass. No substantial change in parenchymal  volume loss and chronic microvascular ischemic changes. Electronically Signed   By: Macy Mis M.D.   On: 06/26/2020 14:37   MR Cervical Spine Wo Contrast  Result Date: 06/26/2020 CLINICAL DATA:  Fall EXAM: MRI CERVICAL SPINE WITHOUT CONTRAST TECHNIQUE: Multiplanar, multisequence MR imaging of the cervical spine was performed. No intravenous contrast was administered. COMPARISON:  None. FINDINGS: Motion artifact is present. Axial GRE sequence was not obtained as the patient could  not tolerate additional time. Alignment: Trace retrolisthesis at C3-C4. Vertebrae: Vertebral body heights are maintained apart from mild degenerative plate irregularity. Mild marrow edema associated with the left C4-C5 facets on a degenerative basis. Cord: No abnormal signal. Posterior Fossa, vertebral arteries, paraspinal tissues: Intracranial findings dictated separately. No prevertebral edema. Disc levels: C2-C3:  Small endplate osteophytes.  No canal or foraminal stenosis. C3-C4: Disc bulge with endplate osteophytes. Uncovertebral and facet hypertrophy. Slight effacement of the ventral subarachnoid space without canal stenosis. Probable mild right foraminal stenosis. No left foraminal stenosis. C4-C5: Disc bulge with endplate osteophytes. Uncovertebral and facet hypertrophy. Slight effacement of the ventral subarachnoid space without canal stenosis. Probable mild to moderate right and marked left foraminal stenosis. C5-C6: Disc bulge with endplate osteophytes. Uncovertebral and facet hypertrophy. Slight effacement of the ventral subarachnoid space without canal stenosis. Probable mild to moderate right and marked left foraminal stenosis. C6-C7: Disc bulge with endplate osteophytes. Uncovertebral and facet hypertrophy. Slight effacement of the ventral subarachnoid space without canal stenosis. Mild right and marked left foraminal stenosis. C7-T1:  No canal or foraminal stenosis. IMPRESSION: Multilevel degenerative changes as  detailed above. Suboptimal stenosis evaluation due to artifact. There is no significant canal narrowing. Multilevel left greater than right foraminal stenosis. Electronically Signed   By: Macy Mis M.D.   On: 06/26/2020 14:44   CT Maxillofacial Wo Contrast  Result Date: 06/26/2020 CLINICAL DATA:  Fall.  Found down. EXAM: CT HEAD WITHOUT CONTRAST CT MAXILLOFACIAL WITHOUT CONTRAST TECHNIQUE: Multidetector CT imaging of the head and maxillofacial structures were performed using the standard protocol without intravenous contrast. Multiplanar CT image reconstructions of the maxillofacial structures were also generated. COMPARISON:  CT head December 30, 2019. CT cervical spine September 11, 2017. FINDINGS: CT HEAD FINDINGS Brain: No evidence of acute infarction, hemorrhage, hydrocephalus, extra-axial collection or mass lesion/mass effect. Similar patchy hypodensities within the white matter, likely related to chronic microvascular ischemic disease. Similar generalized cerebral atrophy with ex vacuo ventricular dilation. Vascular: Calcific atherosclerosis. No hyperdense vessel identified. Skull: No acute fracture. Other: No mastoid effusions. CT MAXILLOFACIAL FINDINGS Osseous: No fracture or mandibular dislocation. No destructive process. Dental disease with multiple missing teeth, periapical lucencies and dental caries. Orbits: Negative. No traumatic or inflammatory finding. Sinuses: Near complete opacification of the left maxillary sinus with areas of internal hyperdensity. Bony wall thickening of the surrounding maxillary sinus wall suggests this is a chronic process. Wind left maxillary ostium and medial bowing and thinning of the medial maxillary sinus wall with narrowed left medial meatus. Mucosal thickening of the left anterior ethmoid air cells. Soft tissues: Negative. CT CERVICAL FINDINGS Alignment: Similar to prior. Straightening of the normal cervical lordosis with slight anterolisthesis of C4 on C5. Skull  base and vertebra: No acute fracture. Similar corticated fragment along the right occipital condyle, likely degenerative or secondary to remote trauma. Soft tissues and canal: No prevertebral fluid or swelling. No visible canal hematoma. Disc levels: Similar moderate degenerative disease at C5-C6 with disc height loss and endplate sclerosis. Multilevel facet/uncovertebral hypertrophy, greatest on the left at C4-C5 where there is foraminal narrowing. Upper chest: Clear visualized lung apices. IMPRESSION: CT head: No evidence of acute intracranial abnormality. CT Maxillofacial: 1. No acute fracture. 2. 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 Areas of internal hyperdensity may represent inspissated secretions and/or fungal colonization. There is widening of the left maxillary ostium and medial bowing and thinning or dehiscence of the medial maxillary sinus wall, suggesting possible  underlying obstructing mass (most likely a polyp). Recommend correlation with direct inspection. CT cervical spine: 1. No evidence of acute fracture or traumatic malalignment. 2. Moderate degenerative disc disease at C5-C6. Focal left facet arthropathy at C4-C5 where there is foraminal stenosis. MRI could further characterize if clinically indicated. Electronically Signed   By: Margaretha Sheffield MD   On: 06/26/2020 08:40     Subjective: Confused, but responsive more to family on their arrival. Shakes his head yes that he wants to go home.  Discharge Exam: Vitals:   06/28/20 1134 06/28/20 1533  BP: 121/82 139/82  Pulse: 73 69  Resp: 16 17  Temp: 97.7 F (36.5 C) 97.6 F (36.4 C)  SpO2:  99%   General: Chronically ill-appearing, frail thin male in no acute distress Cardiovascular: RRR, S1/S2 +, no rubs, no gallops Respiratory: CTA bilaterally, no wheezing, no rhonchi Abdominal: Soft, NT, ND, bowel sounds + Extremities: No edema, no  cyanosis  Labs: BNP (last 3 results) No results for input(s): BNP in the last 8760 hours. Basic Metabolic Panel: Recent Labs  Lab 06/26/20 0810 06/26/20 1001 06/27/20 0426  NA 139  --  140  K 3.8  --  3.5  CL 108  --  107  CO2 18*  --  25  GLUCOSE 68*  --  85  BUN 13  --  8  CREATININE 0.71  --  0.66  CALCIUM 8.6*  --  8.8*  MG  --  1.6*  --   PHOS  --  3.3  --    Liver Function Tests: Recent Labs  Lab 06/26/20 0810  AST 17  ALT 11  ALKPHOS 55  BILITOT 1.3*  PROT 7.0  ALBUMIN 3.7   No results for input(s): LIPASE, AMYLASE in the last 168 hours. Recent Labs  Lab 06/26/20 0810  AMMONIA 12   CBC: Recent Labs  Lab 06/26/20 0725 06/27/20 0426  WBC 9.7 6.4  NEUTROABS 7.6  --   HGB 14.0 13.0  HCT 42.9 39.3  MCV 95.8 94.7  PLT 158 181   Cardiac Enzymes: Recent Labs  Lab 06/26/20 0810  CKTOTAL 127   BNP: Invalid input(s): POCBNP CBG: Recent Labs  Lab 06/28/20 0049 06/28/20 0411 06/28/20 0715 06/28/20 1131 06/28/20 1536  GLUCAP 190* 90 71 123* 93   D-Dimer No results for input(s): DDIMER in the last 72 hours. Hgb A1c No results for input(s): HGBA1C in the last 72 hours. Lipid Profile No results for input(s): CHOL, HDL, LDLCALC, TRIG, CHOLHDL, LDLDIRECT in the last 72 hours. Thyroid function studies No results for input(s): TSH, T4TOTAL, T3FREE, THYROIDAB in the last 72 hours.  Invalid input(s): FREET3 Anemia work up No results for input(s): VITAMINB12, FOLATE, FERRITIN, TIBC, IRON, RETICCTPCT in the last 72 hours. Urinalysis    Component Value Date/Time   COLORURINE YELLOW (A) 06/26/2020 0713   APPEARANCEUR CLEAR (A) 06/26/2020 0713   LABSPEC 1.019 06/26/2020 0713   PHURINE 6.0 06/26/2020 0713   GLUCOSEU NEGATIVE 06/26/2020 0713   HGBUR NEGATIVE 06/26/2020 0713   BILIRUBINUR NEGATIVE 06/26/2020 0713   KETONESUR 20 (A) 06/26/2020 0713   PROTEINUR NEGATIVE 06/26/2020 0713   NITRITE NEGATIVE 06/26/2020 0713   LEUKOCYTESUR NEGATIVE  06/26/2020 0713    Microbiology Recent Results (from the past 240 hour(s))  Resp Panel by RT-PCR (Flu A&B, Covid) Nasopharyngeal Swab     Status: None   Collection Time: 06/26/20  7:25 AM   Specimen: Nasopharyngeal Swab; Nasopharyngeal(NP) swabs in vial transport medium  Result  Value Ref Range Status   SARS Coronavirus 2 by RT PCR NEGATIVE NEGATIVE Final    Comment: (NOTE) SARS-CoV-2 target nucleic acids are NOT DETECTED.  The SARS-CoV-2 RNA is generally detectable in upper respiratory specimens during the acute phase of infection. The lowest concentration of SARS-CoV-2 viral copies this assay can detect is 138 copies/mL. A negative result does not preclude SARS-Cov-2 infection and should not be used as the sole basis for treatment or other patient management decisions. A negative result may occur with  improper specimen collection/handling, submission of specimen other than nasopharyngeal swab, presence of viral mutation(s) within the areas targeted by this assay, and inadequate number of viral copies(<138 copies/mL). A negative result must be combined with clinical observations, patient history, and epidemiological information. The expected result is Negative.  Fact Sheet for Patients:  EntrepreneurPulse.com.au  Fact Sheet for Healthcare Providers:  IncredibleEmployment.be  This test is no t yet approved or cleared by the [place] FDA and  has been authorized for detection and/or diagnosis of SARS-CoV-2 by FDA under an Emergency Use Authorization (EUA). This EUA will remain  in effect (meaning this test can be used) for the duration of the COVID-19 declaration under Section 564(b)(1) of the Act, 21 U.S.C.section 360bbb-3(b)(1), unless the authorization is terminated  or revoked sooner.       Influenza A by PCR NEGATIVE NEGATIVE Final   Influenza B by PCR NEGATIVE NEGATIVE Final    Comment: (NOTE) The Xpert Xpress  SARS-CoV-2/FLU/RSV plus assay is intended as an aid in the diagnosis of influenza from Nasopharyngeal swab specimens and should not be used as a sole basis for treatment. Nasal washings and aspirates are unacceptable for Xpert Xpress SARS-CoV-2/FLU/RSV testing.  Fact Sheet for Patients: EntrepreneurPulse.com.au  Fact Sheet for Healthcare Providers: IncredibleEmployment.be  This test is not yet approved or cleared by the [place] FDA and has been authorized for detection and/or diagnosis of SARS-CoV-2 by FDA under an Emergency Use Authorization (EUA). This EUA will remain in effect (meaning this test can be used) for the duration of the COVID-19 declaration under Section 564(b)(1) of the Act, 21 U.S.C. section 360bbb-3(b)(1), unless the authorization is terminated or revoked.  Performed at [facility], [address], [place], [place] [address]     Time coordinating discharge: Approximately 40 minutes  Patrecia Pour, MD  [facility] 06/28/2020, 5:13 PM

## 2020-06-28 NOTE — Progress Notes (Signed)
PROGRESS NOTE  Peter Howard.  BZJ:[id number] DOB: [birth year] DOA: 06/26/2020 PCP: Rusty Aus, MD   Brief Narrative: Peter Howard. is a 75 y.o. male with a suspected history of alcohol abuse who presented to the ED by EMS after being found incontinent and confused on the ground outside his apartment around 7am. Glucose was 67mg /dl, though otherwise labs were unremarkable. UDS and alcohol level negative. CT head nonacute, CXR negative. Maxillofacial CT showed chronic sinus disease as well as changes possibly related to nasal polyp. Cervical spine CT showed no acute fracture or malalignment. He has been oriented only to himself and we have been unable to corroborate baseline mentation with family.   Per alternative chart in Epic: PMH includes DM, HTN, HLD, PVD, CVA/TIA, and BPH. Medications including aricept, remeron, omeprazole, and tamsulosin are included in ED note from earlier this month which were all listed as "not taking" per patient. There is also an encounter in the [facility] ED from 10/31 - 11/5 under very similar circumstances to this presentation where the patient was kept awaiting placement not under IVC. It is unclear where he went on 11/5.   Assessment & Plan: Principal Problem:   AMS (altered mental status) Active Problems:   Alcohol dependence ([place])   Hypoglycemia   Acute encephalopathy  Found down:  - DC telemetry as this is not being tolerated by pt's agitation. No dysrhythmias noted on highly artifect-ridden telemetry monitoring on my personal review this morning.  - Seizure precautions. If any seizure-like activity would perform EEG.   Encephalopathy: Presumably acute, possibly acute on chronic. CT without hemorrhage/mass, tox screen and EtOH level negative. No severe metabolic derangements that would be causative. ?If prolonged hypoglycemia could be causative.  - Continue sitter at bedside for patient safety.   History of alcohol abuse:  - CIWA as ordered, though  will DC this if no evidence of withdrawal emerges in next 24 hours.  Hypoglycemia: Recurrent. Suspect impaired gluconeogenesis from alcohol use in the past and chronic malnutrition - Continue D10 in IVF and CBG checks.   Maxillary sinus abnormalities on CT:  - Will recommend ENT follow up.  DVT prophylaxis: Lovenox Code Status: Full Family Communication: Step daughter by phone yesterday, will meet her/family at bedside this afternoon.   Disposition Plan:  Status is: Inpatient  Remains inpatient appropriate because:Altered mental status  Dispo: The patient is from: Home              Anticipated d/c is to: Home              Patient currently is not medically stable to d/c.   Difficult to place patient No  Consultants:   None  Procedures:   None  Antimicrobials:  None   Subjective: Restless throughout the night, has not eaten or drunk anything despite encouragement to do so. Not cooperative with encounter but does not appear to be in pain.  Objective: Vitals:   06/28/20 0353 06/28/20 0424 06/28/20 0719 06/28/20 1134  BP: 119/85  121/70 121/82  Pulse: 72  (!) 57 73  Resp: 20  17 16   Temp: (!) (P) 97 F (36.1 C)  97.6 F (36.4 C) 97.7 F (36.5 C)  TempSrc: (P) Axillary  Oral Oral  SpO2: 94%     Weight:  54.4 kg    Height:        Intake/Output Summary (Last 24 hours) at 06/28/2020 1230 Last data filed at 06/28/2020 1031 Gross per 24 hour  Intake 417.41  ml  Output 2000 ml  Net -1582.59 ml   Filed Weights   06/26/20 0727 06/26/20 2100 06/28/20 0424  Weight: 62.6 kg 55.8 kg 54.4 kg   Gen: Thin chronically ill-appearing male in no distress Pulm: Nonlabored breathing room air. Clear. CV: Regular rate and rhythm. No murmur, rub, or gallop. No JVD, no dependent edema. GI: Abdomen soft, non-tender, non-distended, with normoactive bowel sounds.  Ext: Warm, no deformities Skin: No large rashes, lesions or ulcers on visualized skin. Neuro: Alert and disoriented,  moves all extremities.  Psych: Not agitated at this time.  Data Reviewed: I have personally reviewed following labs and imaging studies  CBC: Recent Labs  Lab 06/26/20 0725 06/27/20 0426  WBC 9.7 6.4  NEUTROABS 7.6  --   HGB 14.0 13.0  HCT 42.9 39.3  MCV 95.8 94.7  PLT 158 086   Basic Metabolic Panel: Recent Labs  Lab 06/26/20 0810 06/26/20 1001 06/27/20 0426  NA 139  --  140  K 3.8  --  3.5  CL 108  --  107  CO2 18*  --  25  GLUCOSE 68*  --  85  BUN 13  --  8  CREATININE 0.71  --  0.66  CALCIUM 8.6*  --  8.8*  MG  --  1.6*  --   PHOS  --  3.3  --    GFR: Estimated Creatinine Clearance: 62.3 mL/min (by C-G formula based on SCr of 0.66 mg/dL). Liver Function Tests: Recent Labs  Lab 06/26/20 0810  AST 17  ALT 11  ALKPHOS 55  BILITOT 1.3*  PROT 7.0  ALBUMIN 3.7   No results for input(s): LIPASE, AMYLASE in the last 168 hours. Recent Labs  Lab 06/26/20 0810  AMMONIA 12   Coagulation Profile: No results for input(s): INR, PROTIME in the last 168 hours. Cardiac Enzymes: Recent Labs  Lab 06/26/20 0810  CKTOTAL 127   BNP (last 3 results) No results for input(s): PROBNP in the last 8760 hours. HbA1C: No results for input(s): HGBA1C in the last 72 hours. CBG: Recent Labs  Lab 06/28/20 0005 06/28/20 0049 06/28/20 0411 06/28/20 0715 06/28/20 1131  GLUCAP 56* 190* 90 71 123*   Lipid Profile: No results for input(s): CHOL, HDL, LDLCALC, TRIG, CHOLHDL, LDLDIRECT in the last 72 hours. Thyroid Function Tests: No results for input(s): TSH, T4TOTAL, FREET4, T3FREE, THYROIDAB in the last 72 hours. Anemia Panel: No results for input(s): VITAMINB12, FOLATE, FERRITIN, TIBC, IRON, RETICCTPCT in the last 72 hours. Urine analysis:    Component Value Date/Time   COLORURINE YELLOW (A) 06/26/2020 0713   APPEARANCEUR CLEAR (A) 06/26/2020 0713   LABSPEC 1.019 06/26/2020 0713   PHURINE 6.0 06/26/2020 0713   GLUCOSEU NEGATIVE 06/26/2020 0713   HGBUR NEGATIVE  06/26/2020 0713   BILIRUBINUR NEGATIVE 06/26/2020 0713   KETONESUR 20 (A) 06/26/2020 0713   PROTEINUR NEGATIVE 06/26/2020 0713   NITRITE NEGATIVE 06/26/2020 0713   LEUKOCYTESUR NEGATIVE 06/26/2020 0713   Recent Results (from the past 240 hour(s))  Resp Panel by RT-PCR (Flu A&B, Covid) Nasopharyngeal Swab     Status: None   Collection Time: 06/26/20  7:25 AM   Specimen: Nasopharyngeal Swab; Nasopharyngeal(NP) swabs in vial transport medium  Result Value Ref Range Status   SARS Coronavirus 2 by RT PCR NEGATIVE NEGATIVE Final    Comment: (NOTE) SARS-CoV-2 target nucleic acids are NOT DETECTED.  The SARS-CoV-2 RNA is generally detectable in upper respiratory specimens during the acute phase of infection. The lowest  concentration of SARS-CoV-2 viral copies this assay can detect is 138 copies/mL. A negative result does not preclude SARS-Cov-2 infection and should not be used as the sole basis for treatment or other patient management decisions. A negative result may occur with  improper specimen collection/handling, submission of specimen other than nasopharyngeal swab, presence of viral mutation(s) within the areas targeted by this assay, and inadequate number of viral copies(<138 copies/mL). A negative result must be combined with clinical observations, patient history, and epidemiological information. The expected result is Negative.  Fact Sheet for Patients:  EntrepreneurPulse.com.au  Fact Sheet for Healthcare Providers:  IncredibleEmployment.be  This test is no t yet approved or cleared by the [place] FDA and  has been authorized for detection and/or diagnosis of SARS-CoV-2 by FDA under an Emergency Use Authorization (EUA). This EUA will remain  in effect (meaning this test can be used) for the duration of the COVID-19 declaration under Section 564(b)(1) of the Act, 21 U.S.C.section 360bbb-3(b)(1), unless the authorization is terminated   or revoked sooner.       Influenza A by PCR NEGATIVE NEGATIVE Final   Influenza B by PCR NEGATIVE NEGATIVE Final    Comment: (NOTE) The Xpert Xpress SARS-CoV-2/FLU/RSV plus assay is intended as an aid in the diagnosis of influenza from Nasopharyngeal swab specimens and should not be used as a sole basis for treatment. Nasal washings and aspirates are unacceptable for Xpert Xpress SARS-CoV-2/FLU/RSV testing.  Fact Sheet for Patients: EntrepreneurPulse.com.au  Fact Sheet for Healthcare Providers: IncredibleEmployment.be  This test is not yet approved or cleared by the [place] FDA and has been authorized for detection and/or diagnosis of SARS-CoV-2 by FDA under an Emergency Use Authorization (EUA). This EUA will remain in effect (meaning this test can be used) for the duration of the COVID-19 declaration under Section 564(b)(1) of the Act, 21 U.S.C. section 360bbb-3(b)(1), unless the authorization is terminated or revoked.  Performed at [facility], [address] [place], [place] [address]       Radiology Studies: MR BRAIN WO CONTRAST  Result Date: 06/26/2020 CLINICAL DATA:  Fall, confusion EXAM: MRI HEAD WITHOUT CONTRAST TECHNIQUE: Multiplanar, multiecho pulse sequences of the brain and surrounding structures were obtained without intravenous contrast. COMPARISON:  02/24/2019 FINDINGS: Motion artifact is present. Brain: There is no acute infarction or intracranial hemorrhage. There is no intracranial mass, mass effect, or edema. There is no hydrocephalus or extra-axial fluid collection. Prominence of the ventricles and sulci reflects generalized parenchymal volume loss. Patchy and confluent areas of T2 hyperintensity in the supratentorial white matter are nonspecific but probably reflect similar mild to moderate chronic microvascular ischemic changes. Susceptibility hypointensity is identified in right occipital sulci reflecting  chronic subarachnoid blood products. Vascular: Major vessel flow voids at the skull base are preserved. Skull and upper cervical spine: Normal marrow signal is preserved. Sinuses/Orbits: Chronic left maxillary sinus opacification with tissue extending into the nasal cavity. Left anterior ethmoid mucosal thickening. Orbits are unremarkable. Other: Sella is unremarkable.  Mastoid air cells are clear. IMPRESSION: Motion degraded. No evidence of recent infarction, hemorrhage, or mass. No substantial change in parenchymal volume loss and chronic microvascular ischemic changes. Electronically Signed   By: Macy Mis M.D.   On: 06/26/2020 14:37   MR Cervical Spine Wo Contrast  Result Date: 06/26/2020 CLINICAL DATA:  Fall EXAM: MRI CERVICAL SPINE WITHOUT CONTRAST TECHNIQUE: Multiplanar, multisequence MR imaging of the cervical spine was performed. No intravenous contrast was administered. COMPARISON:  None. FINDINGS: Motion artifact is  present. Axial GRE sequence was not obtained as the patient could not tolerate additional time. Alignment: Trace retrolisthesis at C3-C4. Vertebrae: Vertebral body heights are maintained apart from mild degenerative plate irregularity. Mild marrow edema associated with the left C4-C5 facets on a degenerative basis. Cord: No abnormal signal. Posterior Fossa, vertebral arteries, paraspinal tissues: Intracranial findings dictated separately. No prevertebral edema. Disc levels: C2-C3:  Small endplate osteophytes.  No canal or foraminal stenosis. C3-C4: Disc bulge with endplate osteophytes. Uncovertebral and facet hypertrophy. Slight effacement of the ventral subarachnoid space without canal stenosis. Probable mild right foraminal stenosis. No left foraminal stenosis. C4-C5: Disc bulge with endplate osteophytes. Uncovertebral and facet hypertrophy. Slight effacement of the ventral subarachnoid space without canal stenosis. Probable mild to moderate right and marked left foraminal stenosis.  C5-C6: Disc bulge with endplate osteophytes. Uncovertebral and facet hypertrophy. Slight effacement of the ventral subarachnoid space without canal stenosis. Probable mild to moderate right and marked left foraminal stenosis. C6-C7: Disc bulge with endplate osteophytes. Uncovertebral and facet hypertrophy. Slight effacement of the ventral subarachnoid space without canal stenosis. Mild right and marked left foraminal stenosis. C7-T1:  No canal or foraminal stenosis. IMPRESSION: Multilevel degenerative changes as detailed above. Suboptimal stenosis evaluation due to artifact. There is no significant canal narrowing. Multilevel left greater than right foraminal stenosis. Electronically Signed   By: Macy Mis M.D.   On: 06/26/2020 14:44    Scheduled Meds: . enoxaparin (LOVENOX) injection  40 mg Subcutaneous Q24H  . sodium chloride flush  3 mL Intravenous Q12H   Continuous Infusions: . dextrose 75 mL/hr at 06/28/20 0335     LOS: 1 day   Time spent: 25 minutes.  Patrecia Pour, MD [facility] [email or web address] 06/28/2020, 12:30 PM

## 2020-06-28 NOTE — Progress Notes (Signed)
Pt to be discharged to home with daughter and wife. IV removed from pt. Family given d/c packet and reviewed with daughter.

## 2020-07-08 LAB — COOXEMETRY PANEL
Carboxyhemoglobin: 1.7 % — ABNORMAL HIGH (ref 0.5–1.5)
Methemoglobin: 0.9 % (ref 0.0–1.5)

## 2020-09-26 ENCOUNTER — Emergency Department: Payer: Medicare HMO

## 2020-09-26 ENCOUNTER — Other Ambulatory Visit: Payer: Self-pay

## 2020-09-26 ENCOUNTER — Emergency Department
Admission: EM | Admit: 2020-09-26 | Discharge: 2020-09-27 | Disposition: A | Discharge status: Home / Self Care | Payer: Medicare HMO | Attending: Emergency Medicine | Admitting: Emergency Medicine

## 2020-09-26 ENCOUNTER — Encounter: Payer: Self-pay | Admitting: Emergency Medicine

## 2020-09-26 DIAGNOSIS — E119 Type 2 diabetes mellitus without complications: Secondary | ICD-10-CM | POA: Diagnosis not present

## 2020-09-26 DIAGNOSIS — R404 Transient alteration of awareness: Secondary | ICD-10-CM | POA: Diagnosis not present

## 2020-09-26 DIAGNOSIS — R410 Disorientation, unspecified: Secondary | ICD-10-CM | POA: Diagnosis not present

## 2020-09-26 DIAGNOSIS — I10 Essential (primary) hypertension: Secondary | ICD-10-CM | POA: Diagnosis not present

## 2020-09-26 DIAGNOSIS — R55 Syncope and collapse: Secondary | ICD-10-CM | POA: Insufficient documentation

## 2020-09-26 DIAGNOSIS — T679XXA Effect of heat and light, unspecified, initial encounter: Secondary | ICD-10-CM | POA: Diagnosis not present

## 2020-09-26 DIAGNOSIS — F17210 Nicotine dependence, cigarettes, uncomplicated: Secondary | ICD-10-CM | POA: Insufficient documentation

## 2020-09-26 DIAGNOSIS — S199XXA Unspecified injury of neck, initial encounter: Secondary | ICD-10-CM | POA: Diagnosis not present

## 2020-09-26 DIAGNOSIS — I959 Hypotension, unspecified: Secondary | ICD-10-CM | POA: Diagnosis not present

## 2020-09-26 DIAGNOSIS — Y904 Blood alcohol level of 80-99 mg/100 ml: Secondary | ICD-10-CM | POA: Insufficient documentation

## 2020-09-26 DIAGNOSIS — R42 Dizziness and giddiness: Secondary | ICD-10-CM | POA: Diagnosis present

## 2020-09-26 DIAGNOSIS — Z20822 Contact with and (suspected) exposure to covid-19: Secondary | ICD-10-CM | POA: Insufficient documentation

## 2020-09-26 DIAGNOSIS — Z79899 Other long term (current) drug therapy: Secondary | ICD-10-CM | POA: Insufficient documentation

## 2020-09-26 DIAGNOSIS — R509 Fever, unspecified: Secondary | ICD-10-CM | POA: Diagnosis not present

## 2020-09-26 DIAGNOSIS — E860 Dehydration: Secondary | ICD-10-CM | POA: Insufficient documentation

## 2020-09-26 DIAGNOSIS — R4182 Altered mental status, unspecified: Secondary | ICD-10-CM | POA: Diagnosis not present

## 2020-09-26 DIAGNOSIS — E876 Hypokalemia: Secondary | ICD-10-CM | POA: Insufficient documentation

## 2020-09-26 LAB — CBC WITH DIFFERENTIAL/PLATELET
Abs Immature Granulocytes: 0 10*3/uL (ref 0.00–0.07)
Basophils Absolute: 0 10*3/uL (ref 0.0–0.1)
Basophils Relative: 1 %
Eosinophils Absolute: 0.2 10*3/uL (ref 0.0–0.5)
Eosinophils Relative: 5 %
HCT: 33.8 % — ABNORMAL LOW (ref 39.0–52.0)
Hemoglobin: 11.2 g/dL — ABNORMAL LOW (ref 13.0–17.0)
Immature Granulocytes: 0 %
Lymphocytes Relative: 55 %
Lymphs Abs: 2.2 10*3/uL (ref 0.7–4.0)
MCH: 31.8 pg (ref 26.0–34.0)
MCHC: 33.1 g/dL (ref 30.0–36.0)
MCV: 96 fL (ref 80.0–100.0)
Monocytes Absolute: 0.4 10*3/uL (ref 0.1–1.0)
Monocytes Relative: 10 %
Neutro Abs: 1.1 10*3/uL — ABNORMAL LOW (ref 1.7–7.7)
Neutrophils Relative %: 29 %
Platelets: 168 10*3/uL (ref 150–400)
RBC: 3.52 MIL/uL — ABNORMAL LOW (ref 4.22–5.81)
RDW: 15.5 % (ref 11.5–15.5)
WBC: 4 10*3/uL (ref 4.0–10.5)
nRBC: 0 % (ref 0.0–0.2)

## 2020-09-26 LAB — COMPREHENSIVE METABOLIC PANEL
ALT: 9 U/L (ref 0–44)
AST: 15 U/L (ref 15–41)
Albumin: 3.8 g/dL (ref 3.5–5.0)
Alkaline Phosphatase: 42 U/L (ref 38–126)
Anion gap: 7 (ref 5–15)
BUN: 13 mg/dL (ref 8–23)
CO2: 26 mmol/L (ref 22–32)
Calcium: 8.7 mg/dL — ABNORMAL LOW (ref 8.9–10.3)
Chloride: 107 mmol/L (ref 98–111)
Creatinine, Ser: 0.89 mg/dL (ref 0.61–1.24)
GFR, Estimated: >60 mL/min (ref >60)
Glucose, Bld: 86 mg/dL (ref 70–99)
Potassium: 3.2 mmol/L — ABNORMAL LOW (ref 3.5–5.1)
Sodium: 140 mmol/L (ref 135–145)
Total Bilirubin: 0.7 mg/dL (ref 0.3–1.2)
Total Protein: 6.8 g/dL (ref 6.5–8.1)

## 2020-09-26 LAB — PROCALCITONIN: Procalcitonin: <0.1 ng/mL

## 2020-09-26 LAB — RESP PANEL BY RT-PCR (FLU A&B, COVID) ARPGX2
Influenza A by PCR: NEGATIVE
Influenza B by PCR: NEGATIVE
SARS Coronavirus 2 by RT PCR: NEGATIVE

## 2020-09-26 LAB — URINE DRUG SCREEN, QUALITATIVE (ARMC ONLY)
Amphetamines, Ur Screen: NOT DETECTED
Barbiturates, Ur Screen: NOT DETECTED
Benzodiazepine, Ur Scrn: NOT DETECTED
Cannabinoid 50 Ng, Ur ~~LOC~~: NOT DETECTED
Cocaine Metabolite,Ur ~~LOC~~: NOT DETECTED
MDMA (Ecstasy)Ur Screen: NOT DETECTED
Methadone Scn, Ur: NOT DETECTED
Opiate, Ur Screen: NOT DETECTED
Phencyclidine (PCP) Ur S: NOT DETECTED
Tricyclic, Ur Screen: NOT DETECTED

## 2020-09-26 LAB — URINALYSIS, COMPLETE (UACMP) WITH MICROSCOPIC
Bacteria, UA: NONE SEEN
Bilirubin Urine: NEGATIVE
Glucose, UA: NEGATIVE mg/dL
Hgb urine dipstick: NEGATIVE
Ketones, ur: NEGATIVE mg/dL
Leukocytes,Ua: NEGATIVE
Nitrite: NEGATIVE
Protein, ur: NEGATIVE mg/dL
Specific Gravity, Urine: 1.025 (ref 1.005–1.030)
Squamous Epithelial / HPF: NONE SEEN (ref 0–5)
pH: 5 (ref 5.0–8.0)

## 2020-09-26 LAB — CK: Total CK: 73 U/L (ref 49–397)

## 2020-09-26 LAB — APTT: aPTT: 30 seconds (ref 24–36)

## 2020-09-26 LAB — ETHANOL: Alcohol, Ethyl (B): 94 mg/dL — ABNORMAL HIGH (ref <10)

## 2020-09-26 LAB — MAGNESIUM: Magnesium: 2 mg/dL (ref 1.7–2.4)

## 2020-09-26 LAB — PROTIME-INR
INR: 1.1 (ref 0.8–1.2)
Prothrombin Time: 14 seconds (ref 11.4–15.2)

## 2020-09-26 LAB — LACTIC ACID, PLASMA: Lactic Acid, Venous: 2.3 mmol/L (ref 0.5–1.9)

## 2020-09-26 MED ORDER — POTASSIUM CHLORIDE 10 MEQ/100ML IV SOLN
10.0000 meq | INTRAVENOUS | Status: AC
  Administered 2020-09-26 (×4): 10 meq via INTRAVENOUS
  Filled 2020-09-26 (×4): qty 100

## 2020-09-26 MED ORDER — LACTATED RINGERS IV BOLUS
1000.0000 mL | Freq: Once | INTRAVENOUS | Status: AC
  Administered 2020-09-26: 1000 mL via INTRAVENOUS

## 2020-09-26 MED ORDER — THIAMINE HCL 100 MG/ML IJ SOLN
100.0000 mg | Freq: Once | INTRAMUSCULAR | Status: AC
  Administered 2020-09-26: 100 mg via INTRAVENOUS
  Filled 2020-09-26: qty 2

## 2020-09-26 NOTE — ED Provider Notes (Signed)
[facility] Emergency Department Provider Note  ____________________________________________   Event Date/Time   First MD Initiated Contact with Patient 09/26/20 1804     (approximate)  I have reviewed the triage vital signs and the nursing notes.   HISTORY  Chief Complaint Loss of Consciousness (Pt found down outside by neighbor unresponsive. BP was 82/56 on EMS arrival. Pt alert but confused on arrival. Unknown how long he was down for, but medics state concern for dehydration. Blood sugar normal per EMS. Pt reports feeling slightly lightheaded but denies chest pain or SOB.)   HPI Peter Howard. is a 75 y.o. male with a past medical history of TIA and CVA, HTN, HDL, DM, BPH, glaucoma, PVD and possible early dementia per daughter who presents to EMS after his wife called after she locked patient out of the home with patient being found down by EMS borderline unresponsive.  Per EMS his initial BP was 82/56 and he was awake but very confused.  He was unable to write any additional history although per EMS they thought he smelled of EtOH.  He is unemployed any history on arrival and no additional history is available from EMS.         Past Medical History:  Diagnosis Date   BPH (benign prostatic hypertrophy)    Diabetes mellitus without complication (HCC)    Glaucoma    Hyperlipidemia    Hypertension    Peripheral vascular disease ([place])    Seizures ([place])    Stroke (HCC)    TIA (transient ischemic attack)    Tubular adenoma of colon     Patient Active Problem List   Diagnosis Date Noted   Acute encephalopathy 06/27/2020   AMS (altered mental status) 06/26/2020   Alcohol dependence ([place]) 06/26/2020   Hypoglycemia 06/26/2020    Past Surgical History:  Procedure Laterality Date   COLONOSCOPY     COLONOSCOPY N/A 03/13/2015   Procedure: COLONOSCOPY;  Surgeon: Hulen Luster, MD;  Location: [facility] ENDOSCOPY;  Service: Gastroenterology;  Laterality: N/A;   DIABETIC   EYE SURGERY     glaucoma   FOOT SURGERY     HERNIA REPAIR     INSERTION OF MESH Right 11/10/2018   Procedure: INSERTION OF MESH;  Surgeon: Benjamine Sprague, DO;  Location: [facility] ORS;  Service: General;  Laterality: Right;   ROTATOR CUFF REPAIR     XI ROBOTIC ASSISTED INGUINAL HERNIA REPAIR WITH MESH Right 11/10/2018   Procedure: XI ROBOTIC ASSISTED RIGHT  INGUINAL HERNIA REPAIR;  Surgeon: Benjamine Sprague, DO;  Location: [facility] ORS;  Service: General;  Laterality: Right;    Prior to Admission medications   Medication Sig Start Date End Date Taking? Authorizing Provider  donepezil (ARICEPT) 10 MG tablet Take 10 mg by mouth at bedtime. Patient not taking: No sig reported    [provider]  mirtazapine (REMERON) 15 MG tablet Take 15 mg by mouth at bedtime. Patient not taking: No sig reported    [provider]  omeprazole (PRILOSEC) 40 MG capsule Take 40 mg by mouth daily.  Patient not taking: No sig reported    [provider]  tamsulosin (FLOMAX) 0.4 MG CAPS capsule Take 0.4 mg by mouth daily.  Patient not taking: No sig reported    [provider]    Allergies Penicillins  History reviewed. No pertinent family history.  Social History Social History   Tobacco Use   Smoking status: Every Day    Packs/day: 2.00  Types: Cigarettes   Smokeless tobacco: Never  Vaping Use   Vaping Use: Never used  Substance Use Topics   Alcohol use: Yes   Drug use: No    Review of Systems  Review of Systems  Unable to perform ROS: Mental status change     ____________________________________________   PHYSICAL EXAM:  VITAL SIGNS: ED Triage Vitals  Enc Vitals Group     BP      Pulse      Resp      Temp      Temp src      SpO2      Weight      Height      Head Circumference      Peak Flow      Pain Score      Pain Loc      Pain Edu?      Excl. in [place]?    Vitals:   09/26/20 2230 09/26/20 2300  BP: 123/86 105/71  Pulse: (!) 46 (!)  25  Resp: 16 14  Temp:    SpO2: 96% 100%   Physical Exam Vitals and nursing note reviewed.  Constitutional:      General: He is in acute distress.     Appearance: He is well-developed. He is ill-appearing.  HENT:     Head: Normocephalic and atraumatic.     Right Ear: External ear normal.     Left Ear: External ear normal.     Nose: Nose normal.     Mouth/Throat:     Mouth: Mucous membranes are dry.  Eyes:     Conjunctiva/sclera: Conjunctivae normal.  Cardiovascular:     Rate and Rhythm: Normal rate and regular rhythm.     Heart sounds: No murmur heard. Pulmonary:     Effort: Pulmonary effort is normal. No respiratory distress.     Breath sounds: Normal breath sounds.  Abdominal:     Palpations: Abdomen is soft.     Tenderness: There is no abdominal tenderness.  Musculoskeletal:     Cervical back: Neck supple.  Skin:    General: Skin is warm and dry.     Capillary Refill: Capillary refill takes more than 3 seconds.  Neurological:     Mental Status: He is disoriented.    PERRLA.  EOMI.  Patient is able to give examiner thumbs up with both hands and wiggle both toes on command.  He does not otherwise participate in neurological exam.  There is no obvious trauma to the face scalp head neck extremities chest abdomen or back.  No specific tenderness over the extremities T/C/L-spine. ____________________________________________   LABS (all labs ordered are listed, but only abnormal results are displayed)  Labs Reviewed  ETHANOL - Abnormal; Notable for the following components:      Result Value   Alcohol, Ethyl (B) 94 (*)    All other components within normal limits  LACTIC ACID, PLASMA - Abnormal; Notable for the following components:   Lactic Acid, Venous 2.3 (*)    All other components within normal limits  COMPREHENSIVE METABOLIC PANEL - Abnormal; Notable for the following components:   Potassium 3.2 (*)    Calcium 8.7 (*)    All other components within normal limits   CBC WITH DIFFERENTIAL/PLATELET - Abnormal; Notable for the following components:   RBC 3.52 (*)    Hemoglobin 11.2 (*)    HCT 33.8 (*)    Neutro Abs 1.1 (*)    All other components  within normal limits  URINALYSIS, COMPLETE (UACMP) WITH MICROSCOPIC - Abnormal; Notable for the following components:   Color, Urine YELLOW (*)    APPearance CLEAR (*)    All other components within normal limits  RESP PANEL BY RT-PCR (FLU A&B, COVID) ARPGX2  CULTURE, BLOOD (SINGLE)  URINE CULTURE  CK  MAGNESIUM  PROTIME-INR  APTT  PROCALCITONIN  URINE DRUG SCREEN, QUALITATIVE ([facility] ONLY)  LACTIC ACID, PLASMA   ____________________________________________  EKG  Sinus rhythm with a ventricular rate of 74, normal axis, unremarkable intervals without clear evidence of acute ischemia or significant arrhythmia. ____________________________________________  RADIOLOGY  ED MD interpretation: CT head shows no evidence of SAH, subacute CVA or other any clear acute intra cranial process.  Evidence of chronic microvascular ischemic disease and atrophy.  Chest x-ray shows some bronchitic changes without focal consolidation, effusion, edema or other clear acute intrathoracic process.  Official radiology report(s): CT Head Wo Contrast  Result Date: 09/26/2020 CLINICAL DATA:  Mental status change, unknown cause EXAM: CT HEAD WITHOUT CONTRAST TECHNIQUE: Contiguous axial images were obtained from the base of the skull through the vertex without intravenous contrast. COMPARISON:  06/26/2020 FINDINGS: Brain: There is atrophy and chronic small vessel disease changes. No acute intracranial abnormality. Specifically, no hemorrhage, hydrocephalus, mass lesion, acute infarction, or significant intracranial injury. Vascular: No hyperdense vessel or unexpected calcification. Skull: No acute calvarial abnormality. Sinuses/Orbits: Near complete opacification of the left maxillary sinus and adjacent ethmoid air cells. No change  since prior study. Other: None IMPRESSION: Atrophy, chronic microvascular disease. No acute intracranial abnormality. Electronically Signed   By: Rolm Baptise M.D.   On: 09/26/2020 18:46   DG Chest Port 1 View  Result Date: 09/26/2020 CLINICAL DATA:  Questionable sepsis.  Fever EXAM: PORTABLE CHEST 1 VIEW COMPARISON:  06/26/2020 FINDINGS: Heart is normal size. Peribronchial thickening and interstitial prominence throughout the lungs. No confluent opacities or effusions. No acute bony abnormality. IMPRESSION: Bronchitic changes. Electronically Signed   By: Rolm Baptise M.D.   On: 09/26/2020 18:45    ____________________________________________   PROCEDURES  Procedure(s) performed (including Critical Care):  Procedures   ____________________________________________   INITIAL IMPRESSION / ASSESSMENT AND PLAN / ED COURSE      Patient presents with above-stated history and exam via EMS after he was reportedly found lying almost unresponsive outside in the heat.  Patient is unable provide any history on arrival secondary to altered mental status.  It seems the patient's wife was the one who called EMS after locking patient out of the house and per EMS there thinking he may have been consuming alcohol earlier today.  Unable to reach patient's wife in the ED on 1 attempt however per daughter there is some concern that patient does drink excessively and may have some early dementia.  Initial differential is quite broad and includes possible heatstroke, alcohol intoxication, CVA, metabolic derangements, and sepsis.  CT head shows no evidence of SAH, subacute CVA or other any clear acute intra cranial process.  Evidence of chronic microvascular ischemic disease and atrophy.  Chest x-ray shows some bronchitic changes without focal consolidation, effusion, edema or other clear acute intrathoracic process.  CMP remarkable for K of 3.2 without other significant electrode or metabolic derangements.  CBC  without leukocytosis with a hemoglobin of 11.2.  Lactic acid slightly elevated 2.3.  Serum ethanol elevated at 94.  CK consistent with rhabdomyolysis.  Procalcitonin: Is undetectable overall I have a low suspicion  for sepsis given absence of fever, leukocytosis or  elevated procalcitonin  Patient is not orthostatic Trial of ambulation patient is extremely unsteady on his feet and still not oriented to date or recent events.  Will obtain MRI brain to rule out CVA as well as a CT C-spine to assess for any acute cervical injuries although patient denies any neck pain is no cervical tenderness.  Care patient signed over to oncoming provider approximately 2300.  Plan is to follow-up in our and CT and likely admit  Clinical Course as of 09/27/20 0003  Fri Sep 26, 2020  2335 CT Cervical Spine Wo Contrast [CF]    Clinical Course User Index [CF] Hinda Kehr, MD     ____________________________________________   FINAL CLINICAL IMPRESSION(S) / ED DIAGNOSES  Final diagnoses:  Altered mental status, unspecified altered mental status type  Blood alcohol level of 80-99 mg/100 ml  Dehydration  Hypokalemia    Medications  lactated ringers bolus 1,000 mL (0 mLs Intravenous Stopped 09/26/20 2041)  potassium chloride 10 mEq in 100 mL IVPB (0 mEq Intravenous Stopped 09/26/20 2328)  thiamine (B-1) injection 100 mg (100 mg Intravenous Given 09/26/20 1935)  lactated ringers bolus 1,000 mL (0 mLs Intravenous Stopped 09/26/20 2333)     ED Discharge Orders     None        Note:  This document was prepared using Dragon voice recognition software and may include unintentional dictation errors.    Lucrezia Starch, MD 09/27/20 [phone]

## 2020-09-26 NOTE — ED Notes (Signed)
Tamala Julian, MD notified by this RN of pt lacitc

## 2020-09-27 ENCOUNTER — Emergency Department: Payer: Medicare HMO

## 2020-09-27 DIAGNOSIS — R4182 Altered mental status, unspecified: Secondary | ICD-10-CM | POA: Diagnosis not present

## 2020-09-27 DIAGNOSIS — S199XXA Unspecified injury of neck, initial encounter: Secondary | ICD-10-CM | POA: Diagnosis not present

## 2020-09-27 LAB — LACTIC ACID, PLASMA: Lactic Acid, Venous: 2.1 mmol/L (ref 0.5–1.9)

## 2020-09-27 NOTE — ED Notes (Signed)
Pt assisted in getting dressed. Pt urinated on himself after he got dressed. Family member at bedside to take him home states he does sometimes have urinary incontinence. Pt assisted into wheelchair and taken to family member vehicle. Family member provided with AVS and denies any questions about discharge instructions.

## 2020-09-27 NOTE — ED Notes (Signed)
Spoke to pt wife per pt request. She is calling family to try to get someone to pick him up.

## 2020-09-27 NOTE — Discharge Instructions (Addendum)
Please drink plenty of fluids, avoid drinking too much alcohol, and follow-up with your regular doctor at the next available opportunity.  Return to the emergency department if you develop new or worsening symptoms that concern you.

## 2020-09-27 NOTE — ED Notes (Signed)
Spoke to wife regarding pt being ready for discharge. Per spouse, will call granddaughter right now to pick up pt.

## 2020-09-27 NOTE — ED Provider Notes (Signed)
-----------------------------------------   11:02 PM on 09/26/2020 -----------------------------------------  Assuming care from Dr. Creig Hines.  In short, Peter Howard. is a 75 y.o. male with a chief complaint of altered mental status and fall.  History of alcohol abuse.Marland Kitchen  Refer to the original H&P for additional details.  The current plan of care is to reassess after imaging and work-up is complete to determine if the patient needs to be admitted or can be discharged..    (Note that documentation was delayed due to multiple ED patients requiring immediate care.)  The patient was monitored essentially all night shift in the emergency department.  He showed no signs of alcohol withdrawal and was given the opportunity to metabolize the alcohol he had a system.  His initial lactic acid was elevated at 2.3 which could have been due to overall status, falling down, etc.  He received IV fluids and thiamine as per the orders from Dr. Tamala Julian.  His lab evaluation has been reassuring with a very slightly decreased potassium but otherwise normal metabolic panel, CBC, and urinalysis.  Respiratory viral panel is negative.  Urine drug screen is negative.  Repeat lactic acid came down slightly to 2.1 and he is now awake and alert and seems to be at his baseline.  I talked with him and he said he is ready and wants to go home.  I ask if he thought his wife would come pick him up and he said yes.  He showed good insight into the situation as well when the nurse asked if he could call his wife and he made an attempt at a joke by pointing out that they might have more success if the nurse called his wife instead of him.  He seems to be back at his baseline and there is no indication for admission at this time.  He is hemodynamically stable without evidence of an acute injury or medical emergency.  His MRI did not show any evidence of an acute issue and nor did his CT cervical spine that were added on at the end of Dr.  Tamala Julian shift.  I gave my usual and customary return precautions and the patient agrees with the plan.   Hinda Kehr, MD 09/27/20 [phone]

## 2020-09-28 LAB — URINE CULTURE
Culture: NO GROWTH
Special Requests: NORMAL

## 2020-10-01 LAB — CULTURE, BLOOD (SINGLE)
Culture: NO GROWTH
Special Requests: ADEQUATE

## 2020-10-28 DIAGNOSIS — E538 Deficiency of other specified B group vitamins: Secondary | ICD-10-CM | POA: Diagnosis not present

## 2020-10-28 DIAGNOSIS — Z125 Encounter for screening for malignant neoplasm of prostate: Secondary | ICD-10-CM | POA: Diagnosis not present

## 2020-10-28 DIAGNOSIS — E782 Mixed hyperlipidemia: Secondary | ICD-10-CM | POA: Diagnosis not present

## 2020-10-28 DIAGNOSIS — E440 Moderate protein-calorie malnutrition: Secondary | ICD-10-CM | POA: Diagnosis not present

## 2020-10-28 DIAGNOSIS — F1020 Alcohol dependence, uncomplicated: Secondary | ICD-10-CM | POA: Diagnosis not present

## 2020-10-28 DIAGNOSIS — Z8673 Personal history of transient ischemic attack (TIA), and cerebral infarction without residual deficits: Secondary | ICD-10-CM | POA: Diagnosis not present

## 2020-10-28 DIAGNOSIS — E1151 Type 2 diabetes mellitus with diabetic peripheral angiopathy without gangrene: Secondary | ICD-10-CM | POA: Diagnosis not present

## 2020-10-28 DIAGNOSIS — Z0000 Encounter for general adult medical examination without abnormal findings: Secondary | ICD-10-CM | POA: Diagnosis not present

## 2020-10-28 DIAGNOSIS — Z1389 Encounter for screening for other disorder: Secondary | ICD-10-CM | POA: Diagnosis not present

## 2020-12-03 DIAGNOSIS — Z79899 Other long term (current) drug therapy: Secondary | ICD-10-CM | POA: Diagnosis not present

## 2020-12-03 DIAGNOSIS — Z743 Need for continuous supervision: Secondary | ICD-10-CM | POA: Diagnosis not present

## 2020-12-03 DIAGNOSIS — F0390 Unspecified dementia without behavioral disturbance: Secondary | ICD-10-CM | POA: Diagnosis not present

## 2020-12-03 DIAGNOSIS — R9082 White matter disease, unspecified: Secondary | ICD-10-CM | POA: Diagnosis not present

## 2020-12-03 DIAGNOSIS — J439 Emphysema, unspecified: Secondary | ICD-10-CM | POA: Diagnosis not present

## 2020-12-03 DIAGNOSIS — R410 Disorientation, unspecified: Secondary | ICD-10-CM | POA: Diagnosis not present

## 2020-12-03 DIAGNOSIS — G319 Degenerative disease of nervous system, unspecified: Secondary | ICD-10-CM | POA: Diagnosis not present

## 2020-12-03 DIAGNOSIS — R4182 Altered mental status, unspecified: Secondary | ICD-10-CM | POA: Diagnosis not present

## 2020-12-03 DIAGNOSIS — Z20822 Contact with and (suspected) exposure to covid-19: Secondary | ICD-10-CM | POA: Diagnosis not present

## 2020-12-03 DIAGNOSIS — I10 Essential (primary) hypertension: Secondary | ICD-10-CM | POA: Diagnosis not present

## 2020-12-04 DIAGNOSIS — G319 Degenerative disease of nervous system, unspecified: Secondary | ICD-10-CM | POA: Diagnosis not present

## 2020-12-04 DIAGNOSIS — R9082 White matter disease, unspecified: Secondary | ICD-10-CM | POA: Diagnosis not present

## 2020-12-04 DIAGNOSIS — R4182 Altered mental status, unspecified: Secondary | ICD-10-CM | POA: Diagnosis not present

## 2020-12-05 DIAGNOSIS — R404 Transient alteration of awareness: Secondary | ICD-10-CM | POA: Diagnosis not present

## 2020-12-05 DIAGNOSIS — R6889 Other general symptoms and signs: Secondary | ICD-10-CM | POA: Diagnosis not present

## 2020-12-05 DIAGNOSIS — Z743 Need for continuous supervision: Secondary | ICD-10-CM | POA: Diagnosis not present

## 2020-12-06 DIAGNOSIS — R4182 Altered mental status, unspecified: Secondary | ICD-10-CM | POA: Diagnosis not present

## 2020-12-06 DIAGNOSIS — F0390 Unspecified dementia without behavioral disturbance: Secondary | ICD-10-CM | POA: Diagnosis not present

## 2020-12-15 DIAGNOSIS — Z9183 Wandering in diseases classified elsewhere: Secondary | ICD-10-CM | POA: Diagnosis not present

## 2020-12-15 DIAGNOSIS — R404 Transient alteration of awareness: Secondary | ICD-10-CM | POA: Diagnosis not present

## 2020-12-15 DIAGNOSIS — R159 Full incontinence of feces: Secondary | ICD-10-CM | POA: Diagnosis not present

## 2020-12-15 DIAGNOSIS — R4781 Slurred speech: Secondary | ICD-10-CM | POA: Diagnosis not present

## 2020-12-15 DIAGNOSIS — F0390 Unspecified dementia without behavioral disturbance: Secondary | ICD-10-CM | POA: Diagnosis not present

## 2020-12-15 DIAGNOSIS — R410 Disorientation, unspecified: Secondary | ICD-10-CM | POA: Diagnosis not present

## 2020-12-15 DIAGNOSIS — Z743 Need for continuous supervision: Secondary | ICD-10-CM | POA: Diagnosis not present

## 2020-12-15 DIAGNOSIS — Z79899 Other long term (current) drug therapy: Secondary | ICD-10-CM | POA: Diagnosis not present

## 2020-12-15 DIAGNOSIS — F03B0 Unspecified dementia, moderate, without behavioral disturbance, psychotic disturbance, mood disturbance, and anxiety: Secondary | ICD-10-CM | POA: Diagnosis not present

## 2020-12-15 DIAGNOSIS — R4182 Altered mental status, unspecified: Secondary | ICD-10-CM | POA: Diagnosis not present

## 2020-12-27 DIAGNOSIS — N3289 Other specified disorders of bladder: Secondary | ICD-10-CM | POA: Diagnosis not present

## 2020-12-27 DIAGNOSIS — R112 Nausea with vomiting, unspecified: Secondary | ICD-10-CM | POA: Diagnosis not present

## 2020-12-27 DIAGNOSIS — F0390 Unspecified dementia without behavioral disturbance: Secondary | ICD-10-CM | POA: Diagnosis not present

## 2020-12-27 DIAGNOSIS — Z20822 Contact with and (suspected) exposure to covid-19: Secondary | ICD-10-CM | POA: Diagnosis not present

## 2020-12-27 DIAGNOSIS — Z743 Need for continuous supervision: Secondary | ICD-10-CM | POA: Diagnosis not present

## 2020-12-27 DIAGNOSIS — G319 Degenerative disease of nervous system, unspecified: Secondary | ICD-10-CM | POA: Diagnosis not present

## 2020-12-27 DIAGNOSIS — K5790 Diverticulosis of intestine, part unspecified, without perforation or abscess without bleeding: Secondary | ICD-10-CM | POA: Diagnosis not present

## 2020-12-27 DIAGNOSIS — R110 Nausea: Secondary | ICD-10-CM | POA: Diagnosis not present

## 2020-12-27 DIAGNOSIS — R42 Dizziness and giddiness: Secondary | ICD-10-CM | POA: Diagnosis not present

## 2020-12-27 DIAGNOSIS — I6782 Cerebral ischemia: Secondary | ICD-10-CM | POA: Diagnosis not present

## 2020-12-27 DIAGNOSIS — R109 Unspecified abdominal pain: Secondary | ICD-10-CM | POA: Diagnosis not present

## 2020-12-27 DIAGNOSIS — F0154 Vascular dementia, unspecified severity, with anxiety: Secondary | ICD-10-CM | POA: Diagnosis not present

## 2021-01-30 DIAGNOSIS — R4182 Altered mental status, unspecified: Secondary | ICD-10-CM | POA: Diagnosis not present

## 2021-01-30 DIAGNOSIS — G308 Other Alzheimer's disease: Secondary | ICD-10-CM | POA: Diagnosis not present

## 2021-01-30 DIAGNOSIS — Z743 Need for continuous supervision: Secondary | ICD-10-CM | POA: Diagnosis not present

## 2021-08-26 DIAGNOSIS — Z681 Body mass index (BMI) 19 or less, adult: Secondary | ICD-10-CM | POA: Diagnosis not present

## 2021-08-26 DIAGNOSIS — E43 Unspecified severe protein-calorie malnutrition: Secondary | ICD-10-CM | POA: Diagnosis not present

## 2021-08-26 DIAGNOSIS — R4182 Altered mental status, unspecified: Secondary | ICD-10-CM | POA: Diagnosis not present

## 2021-08-26 DIAGNOSIS — G9341 Metabolic encephalopathy: Secondary | ICD-10-CM | POA: Diagnosis not present

## 2021-08-26 DIAGNOSIS — N3000 Acute cystitis without hematuria: Secondary | ICD-10-CM | POA: Diagnosis not present

## 2021-08-26 DIAGNOSIS — F0390 Unspecified dementia without behavioral disturbance: Secondary | ICD-10-CM | POA: Diagnosis not present

## 2021-08-26 DIAGNOSIS — Z751 Person awaiting admission to adequate facility elsewhere: Secondary | ICD-10-CM | POA: Diagnosis not present

## 2021-08-27 DIAGNOSIS — F0390 Unspecified dementia without behavioral disturbance: Secondary | ICD-10-CM | POA: Diagnosis not present

## 2021-08-27 DIAGNOSIS — G929 Unspecified toxic encephalopathy: Secondary | ICD-10-CM | POA: Diagnosis not present

## 2021-08-27 DIAGNOSIS — R4182 Altered mental status, unspecified: Secondary | ICD-10-CM | POA: Diagnosis not present

## 2021-08-28 DIAGNOSIS — G929 Unspecified toxic encephalopathy: Secondary | ICD-10-CM | POA: Diagnosis not present

## 2021-08-28 DIAGNOSIS — R4182 Altered mental status, unspecified: Secondary | ICD-10-CM | POA: Diagnosis not present

## 2021-08-28 DIAGNOSIS — F0390 Unspecified dementia without behavioral disturbance: Secondary | ICD-10-CM | POA: Diagnosis not present

## 2021-08-29 DIAGNOSIS — G929 Unspecified toxic encephalopathy: Secondary | ICD-10-CM | POA: Diagnosis not present

## 2021-08-29 DIAGNOSIS — F0390 Unspecified dementia without behavioral disturbance: Secondary | ICD-10-CM | POA: Diagnosis not present

## 2021-08-29 DIAGNOSIS — R4182 Altered mental status, unspecified: Secondary | ICD-10-CM | POA: Diagnosis not present

## 2021-08-30 DIAGNOSIS — G929 Unspecified toxic encephalopathy: Secondary | ICD-10-CM | POA: Diagnosis not present

## 2021-08-30 DIAGNOSIS — F0390 Unspecified dementia without behavioral disturbance: Secondary | ICD-10-CM | POA: Diagnosis not present

## 2021-08-30 DIAGNOSIS — R4182 Altered mental status, unspecified: Secondary | ICD-10-CM | POA: Diagnosis not present

## 2021-08-31 DIAGNOSIS — G929 Unspecified toxic encephalopathy: Secondary | ICD-10-CM | POA: Diagnosis not present

## 2021-08-31 DIAGNOSIS — R4182 Altered mental status, unspecified: Secondary | ICD-10-CM | POA: Diagnosis not present

## 2021-08-31 DIAGNOSIS — F0390 Unspecified dementia without behavioral disturbance: Secondary | ICD-10-CM | POA: Diagnosis not present

## 2021-09-01 DIAGNOSIS — R4182 Altered mental status, unspecified: Secondary | ICD-10-CM | POA: Diagnosis not present

## 2021-09-01 DIAGNOSIS — G929 Unspecified toxic encephalopathy: Secondary | ICD-10-CM | POA: Diagnosis not present

## 2021-09-01 DIAGNOSIS — F0390 Unspecified dementia without behavioral disturbance: Secondary | ICD-10-CM | POA: Diagnosis not present

## 2021-09-02 DIAGNOSIS — F0390 Unspecified dementia without behavioral disturbance: Secondary | ICD-10-CM | POA: Diagnosis not present

## 2021-09-02 DIAGNOSIS — R4182 Altered mental status, unspecified: Secondary | ICD-10-CM | POA: Diagnosis not present

## 2021-09-02 DIAGNOSIS — G929 Unspecified toxic encephalopathy: Secondary | ICD-10-CM | POA: Diagnosis not present

## 2021-09-03 DIAGNOSIS — R4182 Altered mental status, unspecified: Secondary | ICD-10-CM | POA: Diagnosis not present

## 2021-09-03 DIAGNOSIS — G929 Unspecified toxic encephalopathy: Secondary | ICD-10-CM | POA: Diagnosis not present

## 2021-09-03 DIAGNOSIS — F0390 Unspecified dementia without behavioral disturbance: Secondary | ICD-10-CM | POA: Diagnosis not present

## 2021-09-04 DIAGNOSIS — F0390 Unspecified dementia without behavioral disturbance: Secondary | ICD-10-CM | POA: Diagnosis not present

## 2021-09-04 DIAGNOSIS — G929 Unspecified toxic encephalopathy: Secondary | ICD-10-CM | POA: Diagnosis not present

## 2021-09-04 DIAGNOSIS — R4182 Altered mental status, unspecified: Secondary | ICD-10-CM | POA: Diagnosis not present

## 2021-09-05 DIAGNOSIS — G929 Unspecified toxic encephalopathy: Secondary | ICD-10-CM | POA: Diagnosis not present

## 2021-09-05 DIAGNOSIS — F0390 Unspecified dementia without behavioral disturbance: Secondary | ICD-10-CM | POA: Diagnosis not present

## 2021-09-05 DIAGNOSIS — R4182 Altered mental status, unspecified: Secondary | ICD-10-CM | POA: Diagnosis not present

## 2021-09-06 DIAGNOSIS — R4182 Altered mental status, unspecified: Secondary | ICD-10-CM | POA: Diagnosis not present

## 2021-09-06 DIAGNOSIS — G929 Unspecified toxic encephalopathy: Secondary | ICD-10-CM | POA: Diagnosis not present

## 2021-09-06 DIAGNOSIS — F0390 Unspecified dementia without behavioral disturbance: Secondary | ICD-10-CM | POA: Diagnosis not present

## 2021-09-07 DIAGNOSIS — G929 Unspecified toxic encephalopathy: Secondary | ICD-10-CM | POA: Diagnosis not present

## 2021-09-07 DIAGNOSIS — R4182 Altered mental status, unspecified: Secondary | ICD-10-CM | POA: Diagnosis not present

## 2021-09-07 DIAGNOSIS — F0390 Unspecified dementia without behavioral disturbance: Secondary | ICD-10-CM | POA: Diagnosis not present

## 2021-09-08 DIAGNOSIS — R4182 Altered mental status, unspecified: Secondary | ICD-10-CM | POA: Diagnosis not present

## 2021-09-08 DIAGNOSIS — F0390 Unspecified dementia without behavioral disturbance: Secondary | ICD-10-CM | POA: Diagnosis not present

## 2021-09-08 DIAGNOSIS — G929 Unspecified toxic encephalopathy: Secondary | ICD-10-CM | POA: Diagnosis not present

## 2021-09-09 DIAGNOSIS — Z7409 Other reduced mobility: Secondary | ICD-10-CM | POA: Diagnosis not present

## 2021-09-09 DIAGNOSIS — M6281 Muscle weakness (generalized): Secondary | ICD-10-CM | POA: Diagnosis not present

## 2021-09-09 DIAGNOSIS — R1312 Dysphagia, oropharyngeal phase: Secondary | ICD-10-CM | POA: Diagnosis not present

## 2021-09-09 DIAGNOSIS — Z751 Person awaiting admission to adequate facility elsewhere: Secondary | ICD-10-CM | POA: Diagnosis not present

## 2021-09-09 DIAGNOSIS — R4182 Altered mental status, unspecified: Secondary | ICD-10-CM | POA: Diagnosis not present

## 2021-09-09 DIAGNOSIS — R471 Dysarthria and anarthria: Secondary | ICD-10-CM | POA: Diagnosis not present

## 2021-09-09 DIAGNOSIS — N3000 Acute cystitis without hematuria: Secondary | ICD-10-CM | POA: Diagnosis not present

## 2021-09-09 DIAGNOSIS — Z681 Body mass index (BMI) 19 or less, adult: Secondary | ICD-10-CM | POA: Diagnosis not present

## 2021-09-09 DIAGNOSIS — G9341 Metabolic encephalopathy: Secondary | ICD-10-CM | POA: Diagnosis not present

## 2021-09-09 DIAGNOSIS — R404 Transient alteration of awareness: Secondary | ICD-10-CM | POA: Diagnosis not present

## 2021-09-09 DIAGNOSIS — F0390 Unspecified dementia without behavioral disturbance: Secondary | ICD-10-CM | POA: Diagnosis not present

## 2021-09-09 DIAGNOSIS — Z743 Need for continuous supervision: Secondary | ICD-10-CM | POA: Diagnosis not present

## 2021-09-09 DIAGNOSIS — L853 Xerosis cutis: Secondary | ICD-10-CM | POA: Diagnosis not present

## 2021-09-09 DIAGNOSIS — F3289 Other specified depressive episodes: Secondary | ICD-10-CM | POA: Diagnosis not present

## 2021-09-09 DIAGNOSIS — F03911 Unspecified dementia, unspecified severity, with agitation: Secondary | ICD-10-CM | POA: Diagnosis not present

## 2021-09-09 DIAGNOSIS — R41841 Cognitive communication deficit: Secondary | ICD-10-CM | POA: Diagnosis not present

## 2021-09-09 DIAGNOSIS — R2681 Unsteadiness on feet: Secondary | ICD-10-CM | POA: Diagnosis not present

## 2021-09-09 DIAGNOSIS — R262 Difficulty in walking, not elsewhere classified: Secondary | ICD-10-CM | POA: Diagnosis not present

## 2021-09-09 DIAGNOSIS — E43 Unspecified severe protein-calorie malnutrition: Secondary | ICD-10-CM | POA: Diagnosis not present

## 2021-09-09 DIAGNOSIS — G929 Unspecified toxic encephalopathy: Secondary | ICD-10-CM | POA: Diagnosis not present

## 2021-09-10 DIAGNOSIS — E43 Unspecified severe protein-calorie malnutrition: Secondary | ICD-10-CM | POA: Diagnosis not present

## 2021-09-10 DIAGNOSIS — G9341 Metabolic encephalopathy: Secondary | ICD-10-CM | POA: Diagnosis not present

## 2021-09-10 DIAGNOSIS — F0390 Unspecified dementia without behavioral disturbance: Secondary | ICD-10-CM | POA: Diagnosis not present

## 2021-09-11 DIAGNOSIS — F0390 Unspecified dementia without behavioral disturbance: Secondary | ICD-10-CM | POA: Diagnosis not present

## 2021-09-11 DIAGNOSIS — G9341 Metabolic encephalopathy: Secondary | ICD-10-CM | POA: Diagnosis not present

## 2021-09-11 DIAGNOSIS — E43 Unspecified severe protein-calorie malnutrition: Secondary | ICD-10-CM | POA: Diagnosis not present

## 2021-09-14 DIAGNOSIS — E43 Unspecified severe protein-calorie malnutrition: Secondary | ICD-10-CM | POA: Diagnosis not present

## 2021-09-14 DIAGNOSIS — F0390 Unspecified dementia without behavioral disturbance: Secondary | ICD-10-CM | POA: Diagnosis not present

## 2021-09-14 DIAGNOSIS — G9341 Metabolic encephalopathy: Secondary | ICD-10-CM | POA: Diagnosis not present

## 2021-09-15 DIAGNOSIS — F0390 Unspecified dementia without behavioral disturbance: Secondary | ICD-10-CM | POA: Diagnosis not present

## 2021-09-15 DIAGNOSIS — Z7409 Other reduced mobility: Secondary | ICD-10-CM | POA: Diagnosis not present

## 2021-09-15 DIAGNOSIS — R2681 Unsteadiness on feet: Secondary | ICD-10-CM | POA: Diagnosis not present

## 2021-09-15 DIAGNOSIS — E43 Unspecified severe protein-calorie malnutrition: Secondary | ICD-10-CM | POA: Diagnosis not present

## 2021-09-16 DIAGNOSIS — E43 Unspecified severe protein-calorie malnutrition: Secondary | ICD-10-CM | POA: Diagnosis not present

## 2021-09-16 DIAGNOSIS — F0390 Unspecified dementia without behavioral disturbance: Secondary | ICD-10-CM | POA: Diagnosis not present

## 2021-09-16 DIAGNOSIS — L853 Xerosis cutis: Secondary | ICD-10-CM | POA: Diagnosis not present

## 2021-09-18 DIAGNOSIS — E43 Unspecified severe protein-calorie malnutrition: Secondary | ICD-10-CM | POA: Diagnosis not present

## 2021-09-18 DIAGNOSIS — F0390 Unspecified dementia without behavioral disturbance: Secondary | ICD-10-CM | POA: Diagnosis not present

## 2021-09-18 DIAGNOSIS — G9341 Metabolic encephalopathy: Secondary | ICD-10-CM | POA: Diagnosis not present

## 2021-09-22 DIAGNOSIS — Z7409 Other reduced mobility: Secondary | ICD-10-CM | POA: Diagnosis not present

## 2021-09-22 DIAGNOSIS — F0390 Unspecified dementia without behavioral disturbance: Secondary | ICD-10-CM | POA: Diagnosis not present

## 2021-09-22 DIAGNOSIS — R2681 Unsteadiness on feet: Secondary | ICD-10-CM | POA: Diagnosis not present

## 2021-09-22 DIAGNOSIS — G9341 Metabolic encephalopathy: Secondary | ICD-10-CM | POA: Diagnosis not present

## 2021-09-22 DIAGNOSIS — E43 Unspecified severe protein-calorie malnutrition: Secondary | ICD-10-CM | POA: Diagnosis not present

## 2021-09-28 DIAGNOSIS — G9341 Metabolic encephalopathy: Secondary | ICD-10-CM | POA: Diagnosis not present

## 2021-09-28 DIAGNOSIS — E43 Unspecified severe protein-calorie malnutrition: Secondary | ICD-10-CM | POA: Diagnosis not present

## 2021-09-28 DIAGNOSIS — F0390 Unspecified dementia without behavioral disturbance: Secondary | ICD-10-CM | POA: Diagnosis not present

## 2021-09-29 DIAGNOSIS — B351 Tinea unguium: Secondary | ICD-10-CM | POA: Diagnosis not present

## 2021-09-29 DIAGNOSIS — E43 Unspecified severe protein-calorie malnutrition: Secondary | ICD-10-CM | POA: Diagnosis not present

## 2021-09-29 DIAGNOSIS — I739 Peripheral vascular disease, unspecified: Secondary | ICD-10-CM | POA: Diagnosis not present

## 2021-09-29 DIAGNOSIS — R262 Difficulty in walking, not elsewhere classified: Secondary | ICD-10-CM | POA: Diagnosis not present

## 2021-09-29 DIAGNOSIS — Z7409 Other reduced mobility: Secondary | ICD-10-CM | POA: Diagnosis not present

## 2021-09-29 DIAGNOSIS — R2681 Unsteadiness on feet: Secondary | ICD-10-CM | POA: Diagnosis not present

## 2021-09-29 DIAGNOSIS — F0390 Unspecified dementia without behavioral disturbance: Secondary | ICD-10-CM | POA: Diagnosis not present

## 2021-10-05 DIAGNOSIS — Z7689 Persons encountering health services in other specified circumstances: Secondary | ICD-10-CM | POA: Diagnosis not present

## 2021-10-06 DIAGNOSIS — F0390 Unspecified dementia without behavioral disturbance: Secondary | ICD-10-CM | POA: Diagnosis not present

## 2021-10-06 DIAGNOSIS — E43 Unspecified severe protein-calorie malnutrition: Secondary | ICD-10-CM | POA: Diagnosis not present

## 2021-10-06 DIAGNOSIS — G9341 Metabolic encephalopathy: Secondary | ICD-10-CM | POA: Diagnosis not present

## 2021-11-05 DIAGNOSIS — Z4682 Encounter for fitting and adjustment of non-vascular catheter: Secondary | ICD-10-CM | POA: Diagnosis not present

## 2021-11-05 DIAGNOSIS — R627 Adult failure to thrive: Secondary | ICD-10-CM | POA: Diagnosis not present

## 2021-11-05 DIAGNOSIS — J984 Other disorders of lung: Secondary | ICD-10-CM | POA: Diagnosis not present

## 2021-11-05 DIAGNOSIS — K5730 Diverticulosis of large intestine without perforation or abscess without bleeding: Secondary | ICD-10-CM | POA: Diagnosis not present

## 2021-11-05 DIAGNOSIS — N390 Urinary tract infection, site not specified: Secondary | ICD-10-CM | POA: Diagnosis not present

## 2021-11-05 DIAGNOSIS — S91301D Unspecified open wound, right foot, subsequent encounter: Secondary | ICD-10-CM | POA: Diagnosis not present

## 2021-11-05 DIAGNOSIS — R7989 Other specified abnormal findings of blood chemistry: Secondary | ICD-10-CM | POA: Diagnosis not present

## 2021-11-05 DIAGNOSIS — L89159 Pressure ulcer of sacral region, unspecified stage: Secondary | ICD-10-CM | POA: Diagnosis not present

## 2021-11-05 DIAGNOSIS — S91301A Unspecified open wound, right foot, initial encounter: Secondary | ICD-10-CM | POA: Diagnosis not present

## 2021-11-05 DIAGNOSIS — D649 Anemia, unspecified: Secondary | ICD-10-CM | POA: Diagnosis not present

## 2021-11-05 DIAGNOSIS — E860 Dehydration: Secondary | ICD-10-CM | POA: Diagnosis not present

## 2021-11-05 DIAGNOSIS — A419 Sepsis, unspecified organism: Secondary | ICD-10-CM | POA: Diagnosis not present

## 2021-11-05 DIAGNOSIS — N179 Acute kidney failure, unspecified: Secondary | ICD-10-CM | POA: Diagnosis not present

## 2021-11-05 DIAGNOSIS — Z681 Body mass index (BMI) 19 or less, adult: Secondary | ICD-10-CM | POA: Diagnosis not present

## 2021-11-05 DIAGNOSIS — Z515 Encounter for palliative care: Secondary | ICD-10-CM | POA: Diagnosis not present

## 2021-11-05 DIAGNOSIS — B9689 Other specified bacterial agents as the cause of diseases classified elsewhere: Secondary | ICD-10-CM | POA: Diagnosis not present

## 2021-11-05 DIAGNOSIS — N3289 Other specified disorders of bladder: Secondary | ICD-10-CM | POA: Diagnosis not present

## 2021-11-05 DIAGNOSIS — I517 Cardiomegaly: Secondary | ICD-10-CM | POA: Diagnosis not present

## 2021-11-05 DIAGNOSIS — R1310 Dysphagia, unspecified: Secondary | ICD-10-CM | POA: Diagnosis not present

## 2021-11-05 DIAGNOSIS — R911 Solitary pulmonary nodule: Secondary | ICD-10-CM | POA: Diagnosis not present

## 2021-11-05 DIAGNOSIS — Z7189 Other specified counseling: Secondary | ICD-10-CM | POA: Diagnosis not present

## 2021-11-05 DIAGNOSIS — F0390 Unspecified dementia without behavioral disturbance: Secondary | ICD-10-CM | POA: Diagnosis not present

## 2021-11-05 DIAGNOSIS — G9341 Metabolic encephalopathy: Secondary | ICD-10-CM | POA: Diagnosis not present

## 2021-11-05 DIAGNOSIS — I7090 Unspecified atherosclerosis: Secondary | ICD-10-CM | POA: Diagnosis not present

## 2021-11-05 DIAGNOSIS — M6282 Rhabdomyolysis: Secondary | ICD-10-CM | POA: Diagnosis not present

## 2021-11-05 DIAGNOSIS — R918 Other nonspecific abnormal finding of lung field: Secondary | ICD-10-CM | POA: Diagnosis not present

## 2021-11-05 DIAGNOSIS — S99921A Unspecified injury of right foot, initial encounter: Secondary | ICD-10-CM | POA: Diagnosis not present

## 2021-11-05 DIAGNOSIS — G9349 Other encephalopathy: Secondary | ICD-10-CM | POA: Diagnosis not present

## 2021-11-05 DIAGNOSIS — R600 Localized edema: Secondary | ICD-10-CM | POA: Diagnosis not present

## 2021-11-05 DIAGNOSIS — R4182 Altered mental status, unspecified: Secondary | ICD-10-CM | POA: Diagnosis not present

## 2021-11-05 DIAGNOSIS — Z9989 Dependence on other enabling machines and devices: Secondary | ICD-10-CM | POA: Diagnosis not present

## 2021-11-05 DIAGNOSIS — J189 Pneumonia, unspecified organism: Secondary | ICD-10-CM | POA: Diagnosis not present

## 2021-11-05 DIAGNOSIS — M6240 Contracture of muscle, unspecified site: Secondary | ICD-10-CM | POA: Diagnosis not present

## 2021-11-05 DIAGNOSIS — I6782 Cerebral ischemia: Secondary | ICD-10-CM | POA: Diagnosis not present

## 2021-11-05 DIAGNOSIS — R509 Fever, unspecified: Secondary | ICD-10-CM | POA: Diagnosis not present

## 2021-11-05 DIAGNOSIS — E43 Unspecified severe protein-calorie malnutrition: Secondary | ICD-10-CM | POA: Diagnosis not present

## 2021-11-05 DIAGNOSIS — Z20822 Contact with and (suspected) exposure to covid-19: Secondary | ICD-10-CM | POA: Diagnosis not present

## 2021-11-05 DIAGNOSIS — E640 Sequelae of protein-calorie malnutrition: Secondary | ICD-10-CM | POA: Diagnosis not present

## 2021-11-05 DIAGNOSIS — G9340 Encephalopathy, unspecified: Secondary | ICD-10-CM | POA: Diagnosis not present

## 2021-11-05 DIAGNOSIS — R5381 Other malaise: Secondary | ICD-10-CM | POA: Diagnosis not present

## 2021-11-06 DIAGNOSIS — M6282 Rhabdomyolysis: Secondary | ICD-10-CM | POA: Diagnosis not present

## 2021-11-06 DIAGNOSIS — F0390 Unspecified dementia without behavioral disturbance: Secondary | ICD-10-CM | POA: Diagnosis not present

## 2021-11-06 DIAGNOSIS — N179 Acute kidney failure, unspecified: Secondary | ICD-10-CM | POA: Diagnosis not present

## 2021-11-07 DIAGNOSIS — N179 Acute kidney failure, unspecified: Secondary | ICD-10-CM | POA: Diagnosis not present

## 2021-11-07 DIAGNOSIS — M6282 Rhabdomyolysis: Secondary | ICD-10-CM | POA: Diagnosis not present

## 2021-11-07 DIAGNOSIS — F0390 Unspecified dementia without behavioral disturbance: Secondary | ICD-10-CM | POA: Diagnosis not present

## 2021-11-07 DIAGNOSIS — I6782 Cerebral ischemia: Secondary | ICD-10-CM | POA: Diagnosis not present

## 2021-11-08 DIAGNOSIS — F0390 Unspecified dementia without behavioral disturbance: Secondary | ICD-10-CM | POA: Diagnosis not present

## 2021-11-08 DIAGNOSIS — N179 Acute kidney failure, unspecified: Secondary | ICD-10-CM | POA: Diagnosis not present

## 2021-11-08 DIAGNOSIS — M6282 Rhabdomyolysis: Secondary | ICD-10-CM | POA: Diagnosis not present

## 2021-11-09 DIAGNOSIS — M6282 Rhabdomyolysis: Secondary | ICD-10-CM | POA: Diagnosis not present

## 2021-11-09 DIAGNOSIS — F0390 Unspecified dementia without behavioral disturbance: Secondary | ICD-10-CM | POA: Diagnosis not present

## 2021-11-09 DIAGNOSIS — N179 Acute kidney failure, unspecified: Secondary | ICD-10-CM | POA: Diagnosis not present

## 2021-11-10 DIAGNOSIS — N179 Acute kidney failure, unspecified: Secondary | ICD-10-CM | POA: Diagnosis not present

## 2021-11-10 DIAGNOSIS — F0390 Unspecified dementia without behavioral disturbance: Secondary | ICD-10-CM | POA: Diagnosis not present

## 2021-11-10 DIAGNOSIS — M6282 Rhabdomyolysis: Secondary | ICD-10-CM | POA: Diagnosis not present

## 2021-11-11 DIAGNOSIS — M6282 Rhabdomyolysis: Secondary | ICD-10-CM | POA: Diagnosis not present

## 2021-11-11 DIAGNOSIS — N179 Acute kidney failure, unspecified: Secondary | ICD-10-CM | POA: Diagnosis not present

## 2021-11-11 DIAGNOSIS — F0390 Unspecified dementia without behavioral disturbance: Secondary | ICD-10-CM | POA: Diagnosis not present

## 2021-11-12 DIAGNOSIS — N179 Acute kidney failure, unspecified: Secondary | ICD-10-CM | POA: Diagnosis not present

## 2021-11-12 DIAGNOSIS — M6282 Rhabdomyolysis: Secondary | ICD-10-CM | POA: Diagnosis not present

## 2021-11-12 DIAGNOSIS — F0390 Unspecified dementia without behavioral disturbance: Secondary | ICD-10-CM | POA: Diagnosis not present

## 2021-11-13 DIAGNOSIS — N179 Acute kidney failure, unspecified: Secondary | ICD-10-CM | POA: Diagnosis not present

## 2021-11-13 DIAGNOSIS — M6282 Rhabdomyolysis: Secondary | ICD-10-CM | POA: Diagnosis not present

## 2021-11-13 DIAGNOSIS — F0390 Unspecified dementia without behavioral disturbance: Secondary | ICD-10-CM | POA: Diagnosis not present

## 2021-11-15 DIAGNOSIS — N179 Acute kidney failure, unspecified: Secondary | ICD-10-CM | POA: Diagnosis not present

## 2021-11-15 DIAGNOSIS — M6282 Rhabdomyolysis: Secondary | ICD-10-CM | POA: Diagnosis not present

## 2021-11-15 DIAGNOSIS — F0390 Unspecified dementia without behavioral disturbance: Secondary | ICD-10-CM | POA: Diagnosis not present

## 2021-11-16 DIAGNOSIS — F0390 Unspecified dementia without behavioral disturbance: Secondary | ICD-10-CM | POA: Diagnosis not present

## 2021-11-16 DIAGNOSIS — M6282 Rhabdomyolysis: Secondary | ICD-10-CM | POA: Diagnosis not present

## 2021-11-16 DIAGNOSIS — N179 Acute kidney failure, unspecified: Secondary | ICD-10-CM | POA: Diagnosis not present

## 2021-11-17 DIAGNOSIS — M6282 Rhabdomyolysis: Secondary | ICD-10-CM | POA: Diagnosis not present

## 2021-11-17 DIAGNOSIS — F0390 Unspecified dementia without behavioral disturbance: Secondary | ICD-10-CM | POA: Diagnosis not present

## 2021-11-17 DIAGNOSIS — N179 Acute kidney failure, unspecified: Secondary | ICD-10-CM | POA: Diagnosis not present

## 2021-11-18 DIAGNOSIS — F0390 Unspecified dementia without behavioral disturbance: Secondary | ICD-10-CM | POA: Diagnosis not present

## 2021-11-18 DIAGNOSIS — M6282 Rhabdomyolysis: Secondary | ICD-10-CM | POA: Diagnosis not present

## 2021-11-18 DIAGNOSIS — N179 Acute kidney failure, unspecified: Secondary | ICD-10-CM | POA: Diagnosis not present

## 2021-11-19 DIAGNOSIS — M6282 Rhabdomyolysis: Secondary | ICD-10-CM | POA: Diagnosis not present

## 2021-11-19 DIAGNOSIS — N179 Acute kidney failure, unspecified: Secondary | ICD-10-CM | POA: Diagnosis not present

## 2021-11-19 DIAGNOSIS — F0390 Unspecified dementia without behavioral disturbance: Secondary | ICD-10-CM | POA: Diagnosis not present

## 2021-11-20 DIAGNOSIS — N179 Acute kidney failure, unspecified: Secondary | ICD-10-CM | POA: Diagnosis not present

## 2021-11-20 DIAGNOSIS — F0390 Unspecified dementia without behavioral disturbance: Secondary | ICD-10-CM | POA: Diagnosis not present

## 2021-11-20 DIAGNOSIS — M6282 Rhabdomyolysis: Secondary | ICD-10-CM | POA: Diagnosis not present

## 2021-11-21 DIAGNOSIS — N179 Acute kidney failure, unspecified: Secondary | ICD-10-CM | POA: Diagnosis not present

## 2021-11-21 DIAGNOSIS — M6282 Rhabdomyolysis: Secondary | ICD-10-CM | POA: Diagnosis not present

## 2021-11-21 DIAGNOSIS — F0390 Unspecified dementia without behavioral disturbance: Secondary | ICD-10-CM | POA: Diagnosis not present

## 2021-11-22 DIAGNOSIS — F0390 Unspecified dementia without behavioral disturbance: Secondary | ICD-10-CM | POA: Diagnosis not present

## 2021-11-22 DIAGNOSIS — N179 Acute kidney failure, unspecified: Secondary | ICD-10-CM | POA: Diagnosis not present

## 2021-11-22 DIAGNOSIS — M6282 Rhabdomyolysis: Secondary | ICD-10-CM | POA: Diagnosis not present

## 2021-11-23 DIAGNOSIS — M6282 Rhabdomyolysis: Secondary | ICD-10-CM | POA: Diagnosis not present

## 2021-11-23 DIAGNOSIS — N179 Acute kidney failure, unspecified: Secondary | ICD-10-CM | POA: Diagnosis not present

## 2021-11-23 DIAGNOSIS — F0390 Unspecified dementia without behavioral disturbance: Secondary | ICD-10-CM | POA: Diagnosis not present

## 2021-11-24 DIAGNOSIS — N179 Acute kidney failure, unspecified: Secondary | ICD-10-CM | POA: Diagnosis not present

## 2021-11-24 DIAGNOSIS — M6282 Rhabdomyolysis: Secondary | ICD-10-CM | POA: Diagnosis not present

## 2021-11-24 DIAGNOSIS — F0390 Unspecified dementia without behavioral disturbance: Secondary | ICD-10-CM | POA: Diagnosis not present

## 2021-11-25 DIAGNOSIS — F0390 Unspecified dementia without behavioral disturbance: Secondary | ICD-10-CM | POA: Diagnosis not present

## 2021-11-25 DIAGNOSIS — M6282 Rhabdomyolysis: Secondary | ICD-10-CM | POA: Diagnosis not present

## 2021-11-25 DIAGNOSIS — N179 Acute kidney failure, unspecified: Secondary | ICD-10-CM | POA: Diagnosis not present

## 2021-11-25 DIAGNOSIS — S91301A Unspecified open wound, right foot, initial encounter: Secondary | ICD-10-CM | POA: Diagnosis not present

## 2021-11-26 DIAGNOSIS — N179 Acute kidney failure, unspecified: Secondary | ICD-10-CM | POA: Diagnosis not present

## 2021-11-26 DIAGNOSIS — F0390 Unspecified dementia without behavioral disturbance: Secondary | ICD-10-CM | POA: Diagnosis not present

## 2021-11-26 DIAGNOSIS — M6282 Rhabdomyolysis: Secondary | ICD-10-CM | POA: Diagnosis not present

## 2021-11-27 DIAGNOSIS — M6282 Rhabdomyolysis: Secondary | ICD-10-CM | POA: Diagnosis not present

## 2021-11-27 DIAGNOSIS — F0390 Unspecified dementia without behavioral disturbance: Secondary | ICD-10-CM | POA: Diagnosis not present

## 2021-11-27 DIAGNOSIS — S91301D Unspecified open wound, right foot, subsequent encounter: Secondary | ICD-10-CM | POA: Diagnosis not present

## 2021-11-27 DIAGNOSIS — N179 Acute kidney failure, unspecified: Secondary | ICD-10-CM | POA: Diagnosis not present

## 2021-11-30 DIAGNOSIS — S91301D Unspecified open wound, right foot, subsequent encounter: Secondary | ICD-10-CM | POA: Diagnosis not present

## 2021-12-01 DIAGNOSIS — R509 Fever, unspecified: Secondary | ICD-10-CM | POA: Diagnosis not present

## 2021-12-01 DIAGNOSIS — G9340 Encephalopathy, unspecified: Secondary | ICD-10-CM | POA: Diagnosis not present

## 2021-12-02 DIAGNOSIS — G9340 Encephalopathy, unspecified: Secondary | ICD-10-CM | POA: Diagnosis not present

## 2021-12-02 DIAGNOSIS — R509 Fever, unspecified: Secondary | ICD-10-CM | POA: Diagnosis not present

## 2021-12-03 DIAGNOSIS — R509 Fever, unspecified: Secondary | ICD-10-CM | POA: Diagnosis not present

## 2021-12-03 DIAGNOSIS — G9340 Encephalopathy, unspecified: Secondary | ICD-10-CM | POA: Diagnosis not present

## 2021-12-04 DIAGNOSIS — G9340 Encephalopathy, unspecified: Secondary | ICD-10-CM | POA: Diagnosis not present

## 2021-12-04 DIAGNOSIS — R509 Fever, unspecified: Secondary | ICD-10-CM | POA: Diagnosis not present

## 2021-12-07 DIAGNOSIS — R509 Fever, unspecified: Secondary | ICD-10-CM | POA: Diagnosis not present

## 2021-12-07 DIAGNOSIS — G9340 Encephalopathy, unspecified: Secondary | ICD-10-CM | POA: Diagnosis not present

## 2021-12-08 DIAGNOSIS — G9340 Encephalopathy, unspecified: Secondary | ICD-10-CM | POA: Diagnosis not present

## 2021-12-08 DIAGNOSIS — J984 Other disorders of lung: Secondary | ICD-10-CM | POA: Diagnosis not present

## 2021-12-08 DIAGNOSIS — J189 Pneumonia, unspecified organism: Secondary | ICD-10-CM | POA: Diagnosis not present

## 2022-01-29 DEATH — deceased

## 2023-05-03 IMAGING — CT CT CERVICAL SPINE W/O CM
3 of 4 series · 12 of 34 positions shown, 14 images · non-contrast
Comparison: CT cervical spine 06/06/2020

CLINICAL DATA: Neck trauma (Age >= 65y)

Found down outside by neighbor unresponsive.
EXAM:
CT CERVICAL SPINE WITHOUT CONTRAST
TECHNIQUE: Multidetector CT imaging of the cervical spine was performed without
intravenous contrast. Multiplanar CT image reconstructions were also
generated.

[Series 4: sagittal bone · sagittal · 0.27mm/px · 5 of 56 slices shown, 6 images]
[im 19/56  bone]
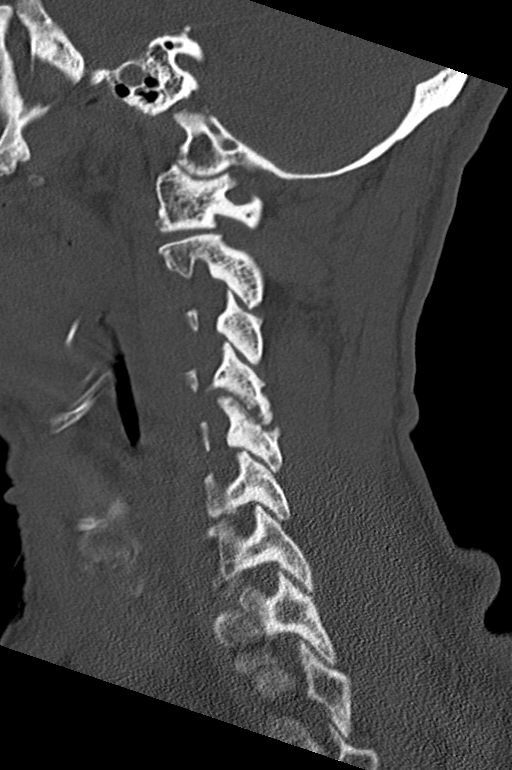
[im 23/56  bone]
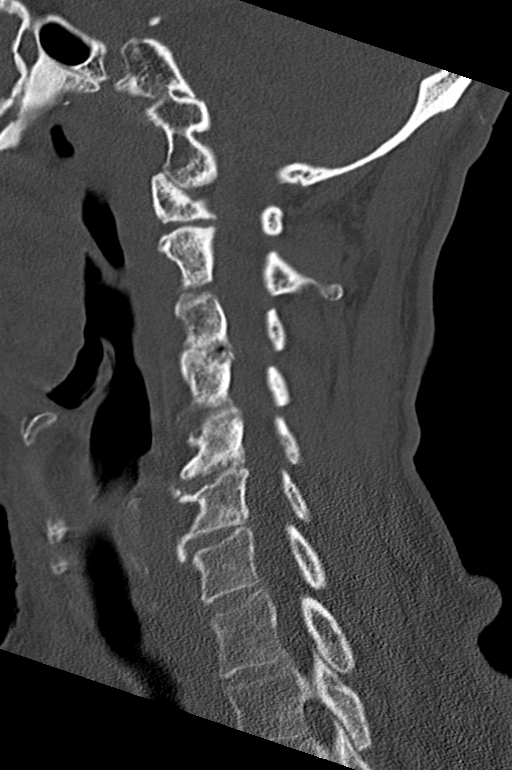
[im 28/56  soft-tissue]
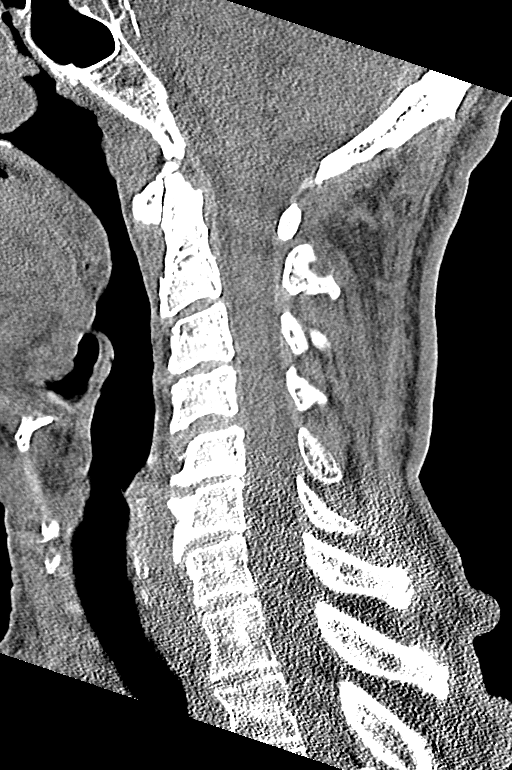
[im 28/56  bone]
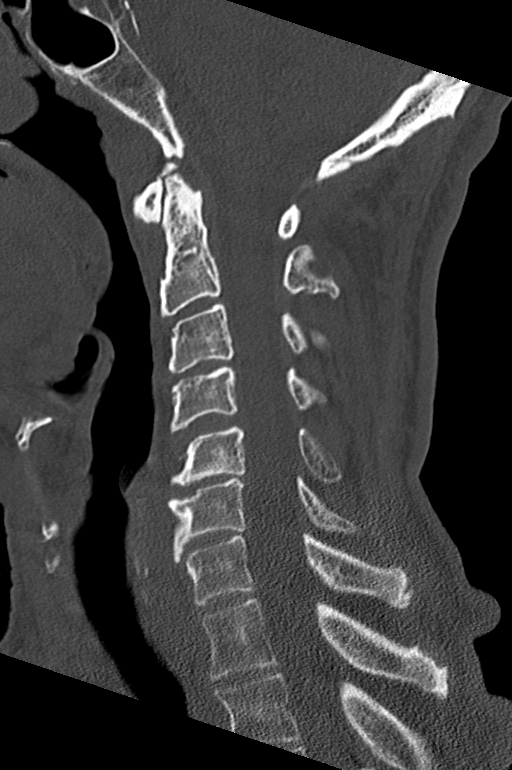
[im 33/56  bone]
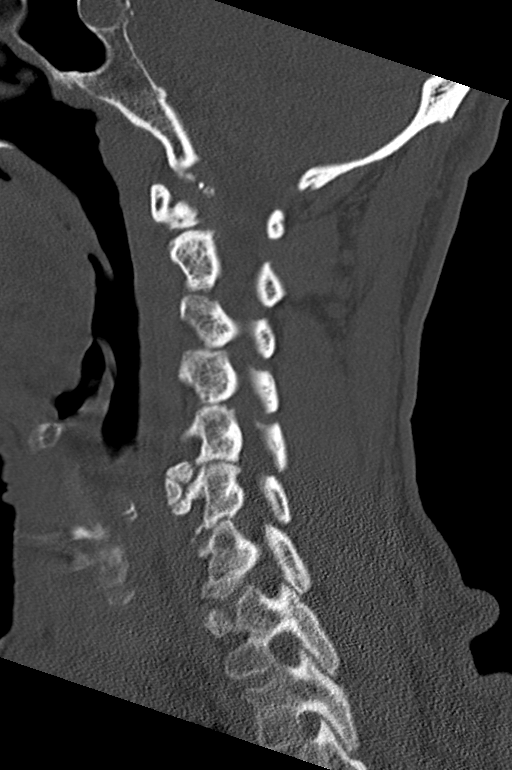
[im 37/56  bone]
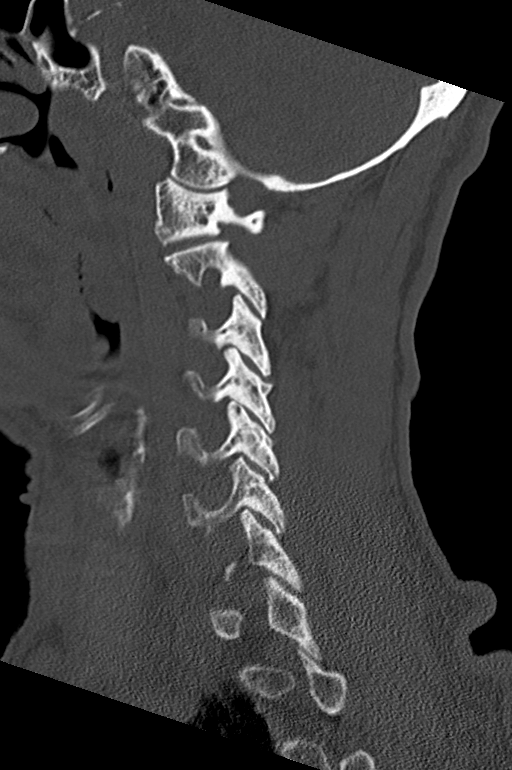

[Series 5: coronal bone · coronal · 0.22mm/px · 3 of 53 slices shown]
[im 11/53  bone]
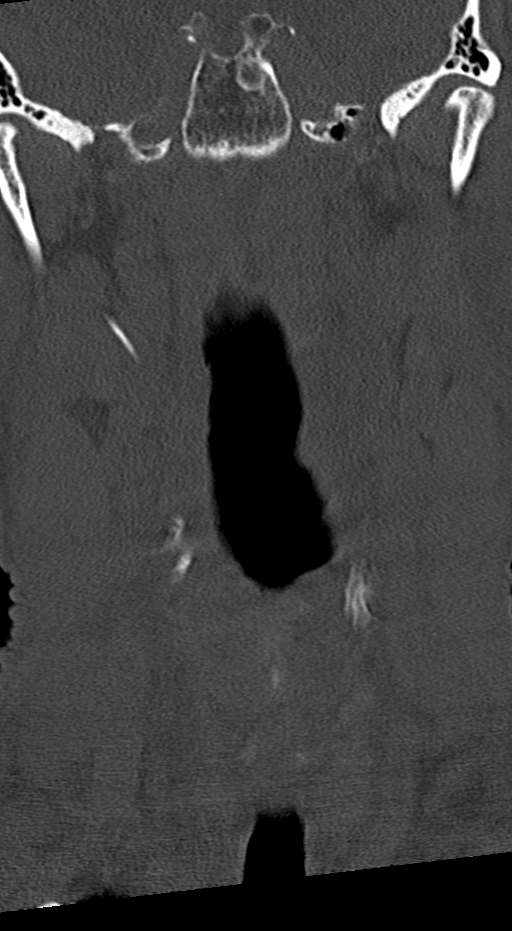
[im 21/53  bone]
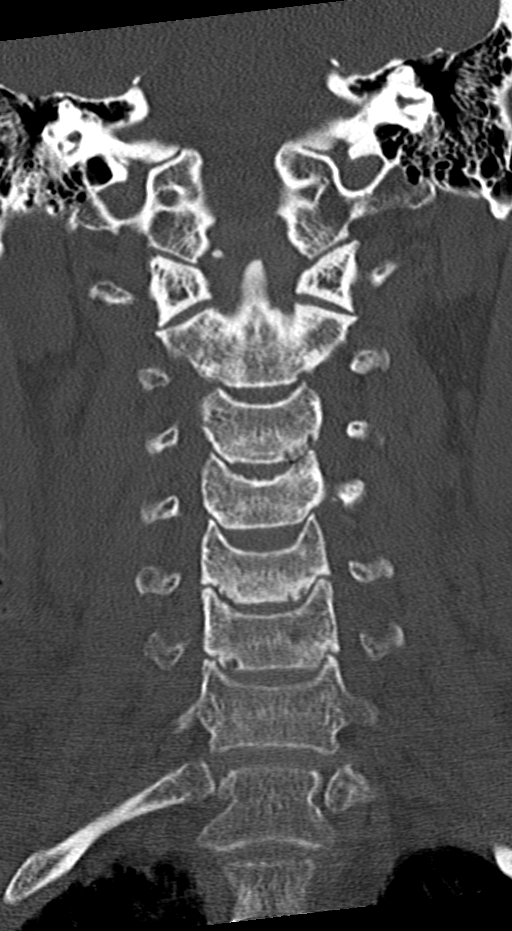
[im 32/53  bone]
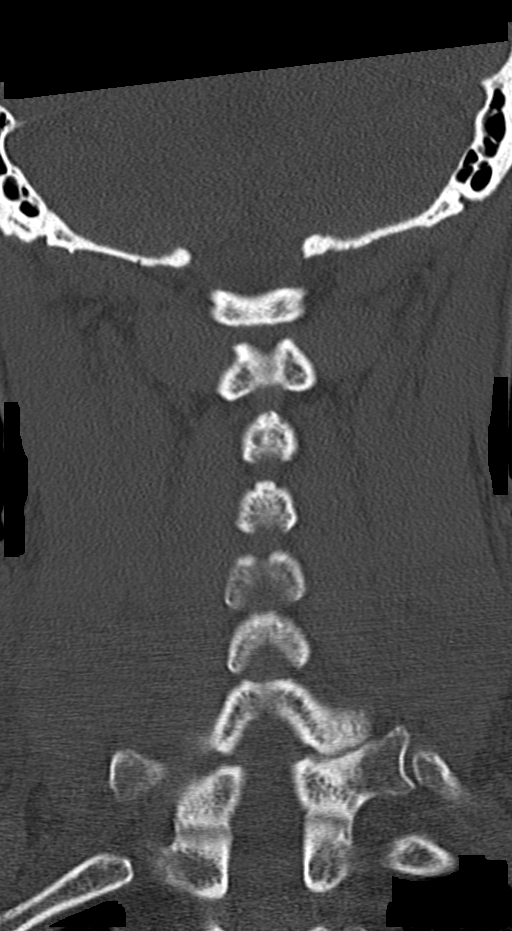

[Series 6: orthogonal bone · axial · 0.23mm/px · z∈[-365,-248]mm · 4 of 98 slices shown, 5 images]
[im 17/98  soft-tissue]
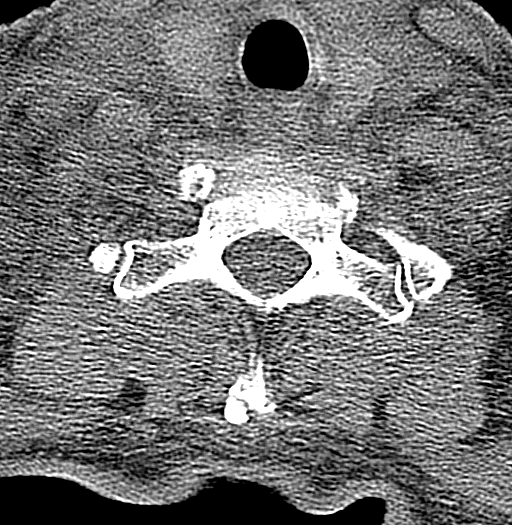
[im 17/98  bone]
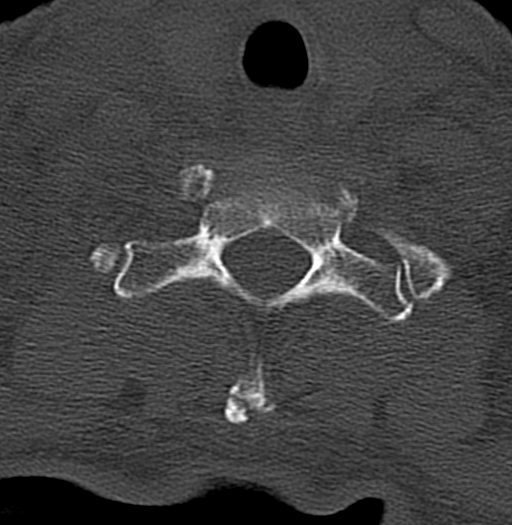
[im 33/98  bone]
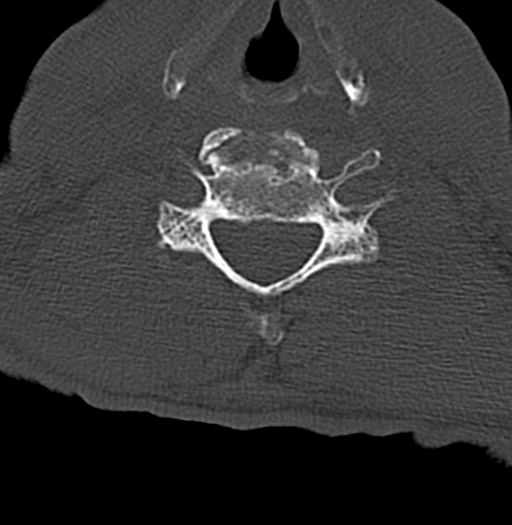
[im 65/98  bone]
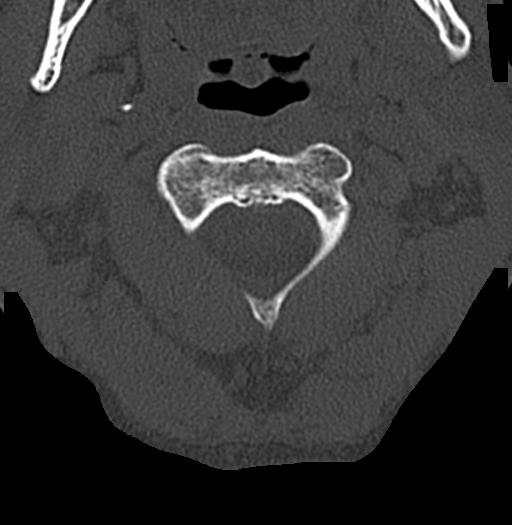
[im 81/98  bone]
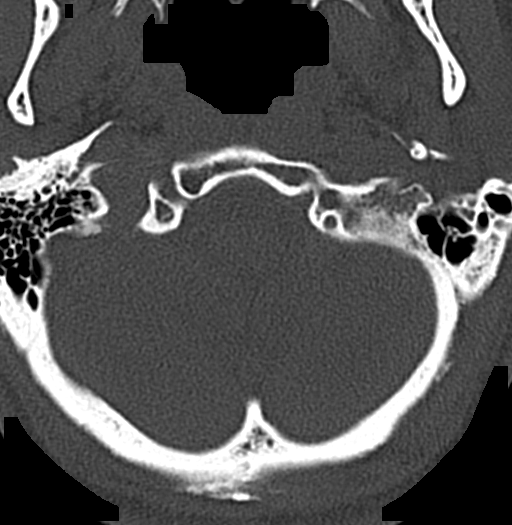

[12 of 34 positions shown; findings below may reference images not displayed]

FINDINGS: Alignment: Unchanged from prior. Straightening of normal lordosis
with minimal anterolisthesis of C4 on C5.

Skull base and vertebrae: No acute fracture. Vertebral body heights
are maintained. The dens and skull base are intact.

Soft tissues and spinal canal: No prevertebral fluid or swelling. No
visible canal hematoma.

Disc levels: Multilevel degenerative disc disease and facet
hypertrophy, similar to prior exams.

Upper chest: No acute findings allowing for motion.

Other: None.
IMPRESSION: 1. No acute fracture or subluxation of the cervical spine.
2. Multilevel degenerative disc disease and facet hypertrophy.
